# Patient Record
Sex: Male | Born: 1960 | Race: White | Hispanic: No | Marital: Married | Smoking: Never smoker
Health system: Southern US, Community
[De-identification: ages and names within clinical notes are randomized; demographics above are authoritative.]

## PROBLEM LIST (undated history)

## (undated) ENCOUNTER — Emergency Department (HOSPITAL_COMMUNITY): Admission: EM | Payer: Self-pay | Source: Home / Self Care

## (undated) DIAGNOSIS — I213 ST elevation (STEMI) myocardial infarction of unspecified site: Secondary | ICD-10-CM

## (undated) DIAGNOSIS — E785 Hyperlipidemia, unspecified: Secondary | ICD-10-CM

## (undated) DIAGNOSIS — I119 Hypertensive heart disease without heart failure: Secondary | ICD-10-CM

## (undated) DIAGNOSIS — N289 Disorder of kidney and ureter, unspecified: Secondary | ICD-10-CM

## (undated) DIAGNOSIS — I2510 Atherosclerotic heart disease of native coronary artery without angina pectoris: Secondary | ICD-10-CM

## (undated) DIAGNOSIS — I519 Heart disease, unspecified: Secondary | ICD-10-CM

## (undated) HISTORY — DX: Hyperlipidemia, unspecified: E78.5

## (undated) HISTORY — DX: Heart disease, unspecified: I51.9

## (undated) HISTORY — DX: Atherosclerotic heart disease of native coronary artery without angina pectoris: I25.10

## (undated) HISTORY — DX: Hypertensive heart disease without heart failure: I11.9

## (undated) HISTORY — DX: ST elevation (STEMI) myocardial infarction of unspecified site: I21.3

## (undated) HISTORY — DX: Disorder of kidney and ureter, unspecified: N28.9

---

## 2002-06-18 ENCOUNTER — Ambulatory Visit (HOSPITAL_COMMUNITY): Admission: RE | Admit: 2002-06-18 | Discharge: 2002-06-18 | Payer: Self-pay | Admitting: Internal Medicine

## 2002-06-18 ENCOUNTER — Encounter: Payer: Self-pay | Admitting: Internal Medicine

## 2010-12-26 DIAGNOSIS — I213 ST elevation (STEMI) myocardial infarction of unspecified site: Secondary | ICD-10-CM

## 2010-12-26 HISTORY — PX: CARDIAC CATHETERIZATION: SHX172

## 2010-12-26 HISTORY — DX: ST elevation (STEMI) myocardial infarction of unspecified site: I21.3

## 2010-12-31 ENCOUNTER — Inpatient Hospital Stay (HOSPITAL_COMMUNITY)
Admission: EM | Admit: 2010-12-31 | Discharge: 2011-01-02 | DRG: 853 | Disposition: A | Discharge status: Home / Self Care | Payer: BC Managed Care – PPO | Source: Ambulatory Visit | Attending: Cardiology | Admitting: Cardiology

## 2010-12-31 ENCOUNTER — Inpatient Hospital Stay (HOSPITAL_COMMUNITY): Payer: BC Managed Care – PPO

## 2010-12-31 DIAGNOSIS — R079 Chest pain, unspecified: Secondary | ICD-10-CM

## 2010-12-31 DIAGNOSIS — N289 Disorder of kidney and ureter, unspecified: Secondary | ICD-10-CM | POA: Diagnosis present

## 2010-12-31 DIAGNOSIS — I2119 ST elevation (STEMI) myocardial infarction involving other coronary artery of inferior wall: Principal | ICD-10-CM | POA: Diagnosis present

## 2010-12-31 DIAGNOSIS — E785 Hyperlipidemia, unspecified: Secondary | ICD-10-CM | POA: Diagnosis present

## 2010-12-31 DIAGNOSIS — I2589 Other forms of chronic ischemic heart disease: Secondary | ICD-10-CM | POA: Diagnosis present

## 2010-12-31 DIAGNOSIS — I2510 Atherosclerotic heart disease of native coronary artery without angina pectoris: Secondary | ICD-10-CM | POA: Diagnosis present

## 2010-12-31 LAB — COMPREHENSIVE METABOLIC PANEL
ALT: 27 U/L (ref 0–53)
Alkaline Phosphatase: 39 U/L (ref 39–117)
CO2: 23 mEq/L (ref 19–32)
Calcium: 8.4 mg/dL (ref 8.4–10.5)
Chloride: 106 mEq/L (ref 96–112)
GFR calc Af Amer: >60 mL/min (ref >60)
GFR calc non Af Amer: >60 mL/min (ref >60)
Glucose, Bld: 121 mg/dL — ABNORMAL HIGH (ref 70–99)
Sodium: 136 mEq/L (ref 135–145)
Total Bilirubin: 0.3 mg/dL (ref 0.3–1.2)

## 2010-12-31 LAB — CBC
HCT: 40.5 % (ref 39.0–52.0)
Hemoglobin: 14.2 g/dL (ref 13.0–17.0)
MCH: 31.9 pg (ref 26.0–34.0)
MCHC: 35.1 g/dL (ref 30.0–36.0)
RBC: 4.45 MIL/uL (ref 4.22–5.81)
RDW: 13.2 % (ref 11.5–15.5)
WBC: 9.9 10*3/uL (ref 4.0–10.5)

## 2010-12-31 LAB — PROTIME-INR
INR: 6.83 (ref 0.00–1.49)
Prothrombin Time: 60.1 seconds — ABNORMAL HIGH (ref 11.6–15.2)

## 2010-12-31 LAB — POCT I-STAT, CHEM 8
HCT: 47 % (ref 39.0–52.0)
Hemoglobin: 16 g/dL (ref 13.0–17.0)
Potassium: 3.4 mEq/L — ABNORMAL LOW (ref 3.5–5.1)
Sodium: 140 mEq/L (ref 135–145)
TCO2: 23 mmol/L (ref 0–100)

## 2010-12-31 LAB — APTT: aPTT: 121 seconds — ABNORMAL HIGH (ref 24–37)

## 2010-12-31 LAB — CARDIAC PANEL(CRET KIN+CKTOT+MB+TROPI)
CK, MB: 14.5 ng/mL (ref 0.3–4.0)
Relative Index: 2.7 — ABNORMAL HIGH (ref 0.0–2.5)
Total CK: 531 U/L — ABNORMAL HIGH (ref 7–232)
Total CK: 2193 U/L — ABNORMAL HIGH (ref 7–232)
Troponin I: 3.57 ng/mL (ref <0.30)

## 2010-12-31 LAB — MRSA PCR SCREENING: MRSA by PCR: NEGATIVE

## 2010-12-31 LAB — POCT ACTIVATED CLOTTING TIME: Activated Clotting Time: 386 seconds

## 2010-12-31 LAB — LIPID PANEL: HDL: 26 mg/dL — ABNORMAL LOW (ref >39)

## 2010-12-31 LAB — HEMOGLOBIN A1C
Hgb A1c MFr Bld: 5.6 % (ref <5.7)
Mean Plasma Glucose: 114 mg/dL (ref <117)

## 2010-12-31 LAB — TSH: TSH: 1.682 u[IU]/mL (ref 0.350–4.500)

## 2011-01-01 DIAGNOSIS — I059 Rheumatic mitral valve disease, unspecified: Secondary | ICD-10-CM

## 2011-01-01 LAB — CARDIAC PANEL(CRET KIN+CKTOT+MB+TROPI)
CK, MB: 124.2 ng/mL (ref 0.3–4.0)
Total CK: 1381 U/L — ABNORMAL HIGH (ref 7–232)
Troponin I: >25 ng/mL (ref <0.30)

## 2011-01-01 LAB — LIPID PANEL
Cholesterol: 138 mg/dL (ref 0–200)
Total CHOL/HDL Ratio: 4.5 RATIO
VLDL: 14 mg/dL (ref 0–40)

## 2011-01-01 LAB — CBC
HCT: 46.7 % (ref 39.0–52.0)
MCH: 32.6 pg (ref 26.0–34.0)
MCV: 91.2 fL (ref 78.0–100.0)
Platelets: 228 10*3/uL (ref 150–400)
RBC: 5.12 MIL/uL (ref 4.22–5.81)
RDW: 13.4 % (ref 11.5–15.5)

## 2011-01-01 LAB — BASIC METABOLIC PANEL
BUN: 10 mg/dL (ref 6–23)
CO2: 27 mEq/L (ref 19–32)
Calcium: 9.6 mg/dL (ref 8.4–10.5)
Chloride: 106 mEq/L (ref 96–112)
Creatinine, Ser: 1.2 mg/dL (ref 0.50–1.35)

## 2011-01-01 LAB — DIFFERENTIAL
Eosinophils Absolute: 0.2 10*3/uL (ref 0.0–0.7)
Eosinophils Relative: 1 % (ref 0–5)
Lymphocytes Relative: 24 % (ref 12–46)
Lymphs Abs: 2.6 10*3/uL (ref 0.7–4.0)
Monocytes Relative: 11 % (ref 3–12)

## 2011-01-02 DIAGNOSIS — I2119 ST elevation (STEMI) myocardial infarction involving other coronary artery of inferior wall: Secondary | ICD-10-CM

## 2011-01-02 LAB — BASIC METABOLIC PANEL
Calcium: 10.1 mg/dL (ref 8.4–10.5)
Creatinine, Ser: 1.38 mg/dL — ABNORMAL HIGH (ref 0.50–1.35)
GFR calc Af Amer: >60 mL/min (ref >60)
GFR calc non Af Amer: 54 mL/min — ABNORMAL LOW (ref >60)
Sodium: 139 mEq/L (ref 135–145)

## 2011-01-02 LAB — CBC
MCH: 31.8 pg (ref 26.0–34.0)
MCHC: 34.5 g/dL (ref 30.0–36.0)
MCV: 92.2 fL (ref 78.0–100.0)
Platelets: 240 10*3/uL (ref 150–400)
RBC: 5.25 MIL/uL (ref 4.22–5.81)
RDW: 13.5 % (ref 11.5–15.5)

## 2011-01-04 ENCOUNTER — Other Ambulatory Visit (INDEPENDENT_AMBULATORY_CARE_PROVIDER_SITE_OTHER): Payer: BC Managed Care – PPO | Admitting: *Deleted

## 2011-01-04 ENCOUNTER — Other Ambulatory Visit: Payer: Self-pay | Admitting: *Deleted

## 2011-01-04 DIAGNOSIS — N289 Disorder of kidney and ureter, unspecified: Secondary | ICD-10-CM

## 2011-01-04 LAB — BASIC METABOLIC PANEL
Calcium: 10.6 mg/dL — ABNORMAL HIGH (ref 8.4–10.5)
GFR: 54.9 mL/min — ABNORMAL LOW (ref >60.00)
Glucose, Bld: 117 mg/dL — ABNORMAL HIGH (ref 70–99)
Potassium: 4.7 mEq/L (ref 3.5–5.1)
Sodium: 138 mEq/L (ref 135–145)

## 2011-01-04 NOTE — Discharge Summary (Signed)
NAMESULEIMAN, FINIGAN NO.:  [id number]  MEDICAL RECORD NO.:  [id number]  LOCATION:  2010                         FACILITY:  [facility]  PHYSICIAN:  Madolyn Frieze. Jens Som, MD, FACCDATE OF BIRTH:  07/24/1960  DATE OF ADMISSION:  12/31/2010 DATE OF DISCHARGE:  01/02/2011                              DISCHARGE SUMMARY   PRIMARY CARDIOLOGIST:  Peter M. Swaziland, MD  PRIMARY CARE PHYSICIAN:  None.  REASON FOR ADMISSION:  Inferior ST-elevation myocardial infarction.  DISCHARGE DIAGNOSES: 1. Coronary artery disease.     a.     Status post inferior ST-elevation myocardial infarction      treated with a drug-eluting stent to the right coronary artery      this admission. 2. Ischemic cardiomyopathy with an ejection fraction of 45-50%. 3. Dyslipidemia. 4. Renal insufficiency.  PROCEDURES PERFORMED THIS ADMISSION:  Cardiac catheterization and percutaneous coronary intervention by Dr. Peter Swaziland December 31, 2010:  RCA large dominant vessel with 100% mid occlusion, left main patent, LAD 20-30% proximal, left circumflex 20% proximal.  PCI:  Promus drug-eluting stent to the mid RCA.  Left ventriculogram of moderate inferior hypokinesis with an EF of 45-50%.  ADMISSION HISTORY:  Mr. Nicolosi is a 50 year old male patient without prior cardiac history who presented via EMS with 10/10 chest pain.  EKG demonstrated inferior ST-elevation.  He was brought emergently to Cardiac Catheterization Lab.  HOSPITAL COURSE:  The patient underwent cardiac catheterization and percutaneous coronary intervention by Dr. Peter Swaziland as outlined above.  He tolerated the procedure well and had no immediate complications. Post MI he was maintained on aspirin, Effient, beta-blocker, and statin. Lisinopril was also added to his medical regimen.  His creatinine increased from 1.2-1.38 post initiation of lisinopril.  The patient was evaluated by Dr. Jens Som on September 8.  He discontinued ACE  inhibitor secondary to increased creatinine.  The patient will need early followup with a basic metabolic panel next week.  He was felt stable enough for discharge to home.  LABS AND ANCILLARY DATA:  Hemoglobin 16.7, potassium 4, creatinine 1.38 on January 02, 2011, ALT 27 upon admission, hemoglobin A1c 5.6.  Peak troponin greater than 25.  Peak CK-MB 192.7, total cholesterol 138, triglycerides 70, HDL 31, LDL 93.  TSH 1.682.  Chest x-ray, normal chest.  DISCHARGE MEDICATIONS: 1. Metoprolol tartrate 25 mg 1/2 tablet twice daily. 2. Nitroglycerin p.r.n. chest pain. 3. Effient 10 mg daily. 4. Crestor 40 mg daily. 5. Aspirin 81 mg daily. 6. Multivitamins daily.  ALLERGIES:  No known drug allergies.  ACTIVITIES:  Increase activity slowly, may walk up steps.  No lifting for 2 weeks.  No driving for 1 week.  No sexual activity for 2 weeks.  DIET:  Low-fat, low-sodium diet.  WOUND CARE:  He should call [facility] office for any swelling, bleeding, bruising, or fever.  FOLLOWUP: 1. He needs a basic metabolic panel to follow up on his renal function     January 04, 2011.  The office will contact him with an     appointment. 2. He will see Dr. Swaziland in 2 weeks.  The office will contact him     with an appointment.  TOTAL PHYSICIAN AND  PA TIME:  Greater than 30 minutes.     Tereso Newcomer, PA-C   ______________________________ Madolyn Frieze. Jens Som, MD, [facility]    SW/MEDQ  D:  01/02/2011  T:  01/02/2011  Job:  [id number]  Electronically Signed by Tereso Newcomer PA-C on 01/03/2011 04:34:43 PM Electronically Signed by Olga Millers MD [facility] on 01/04/2011 07:31:07 PM

## 2011-01-06 NOTE — Progress Notes (Signed)
Wrong phone number

## 2011-01-08 ENCOUNTER — Telehealth: Payer: Self-pay | Admitting: *Deleted

## 2011-01-08 NOTE — Telephone Encounter (Signed)
Have been unable to get in touch w/pt. Have tried several phone numbers. Will mail him a copy of labs and to call us if has qustions.

## 2011-01-08 NOTE — Telephone Encounter (Signed)
Message copied by Lorayne Bender on Fri Jan 08, 2011  8:08 AM ------      Message from: Sw[place], PETER M      Created: Mon Jan 04, 2011  8:34 PM       Creatnine stable at 1.4. Was 1.38 on discharge.

## 2011-01-08 NOTE — Cardiovascular Report (Signed)
Alan Hernandez, Alan Hernandez NO.:  [id number]  MEDICAL RECORD NO.:  [id number]  LOCATION:  2904                         FACILITY:  [facility]  PHYSICIAN:  Zedric Deroy M. [place]and, M.D.  DATE OF BIRTH:  May 11, 1960  DATE OF PROCEDURE:  12/31/2010 DATE OF DISCHARGE:                           CARDIAC CATHETERIZATION   INDICATIONS FOR PROCEDURE:  This is a 50 year old white male who had abrupt onset of severe substernal chest pain at 9:30 a.m.  EMS was called and ECG demonstrated evidence of an inferior ST elevation myocardial infarction with 2-3 mm of ST elevation in leads II, III, and aVF.  PROCEDURES:  Left heart catheterization, coronary and left ventricular angiography.  OPERATOR:  Elga Santy M. [place], MD  ACCESS:  Via the right radial artery using standard Seldinger technique.  EQUIPMENT:  A 6-French 4-cm right Judkins catheter, 6-French 3.5 cm left Judkins catheter, 6-French pigtail catheter, 6-French arterial sheath, 6 FR-4 guide, a Prowater wire, a 2.5 x 12 mm Emerge balloon, a 4.0 x 16 mm Promus element stent, and a 4.0 x 12 mm Rudd TREK balloon.  MEDICATIONS:  Verapamil 3 mg intra-arterial, Angiomax bolus at 0.75 mg/kg followed by continuous infusion 1.75 mg/kg/hour, and Effient 60 mg p.o.  Subsequent ACT was 383 seconds.  CONTRAST:  125 mL of Omnipaque.  HEMODYNAMIC DATA:  Aortic pressure was 139/88 with a mean of 110 mmHg. Left ventricular pressure was 139 with an EDP of 20 mmHg.  ANGIOGRAPHIC DATA:  The right coronary arises and distributes normally. It is a large dominant vessel.  It was completely occluded in the midvessel with an abrupt cutoff.  The left main coronary is normal.  The left anterior descending artery has 20-30% irregularities in the proximal vessel.  It otherwise is normal.  There are faint collaterals to the right coronary artery.  Left circumflex coronary has 20% proximal disease.  We proceeded at this point with emergent intervention of the  right coronary.  After initial guide shots were obtained, we were able to cross the lesion easily with a wire.  We then had reperfusion with dilatation of the lesion with a 2.5 mm balloon up to 6 and then 10 atmospheres.  It was a very large vessel with a relatively focal lesion. This was stented using a 4.0 x 16 mm Promus element stent deployed at 12 atmospheres.  The stent was then postdilated with a 4.0 x 12 mm [place] TREK balloon to 16 atmospheres x2.  This yielded an excellent angiographic result with 0% residual stenosis and TIMI grade 3 flow.  There was marked improvement on his ST elevation.  With initial reperfusion, he did have some frequent ventricular ectopy but this resolved.  Left ventricular angiography was performed in the RAO view.  This demonstrates normal left ventricular size with moderate inferior wall hypokinesis.  Ejection fraction was 45-50%.  FINAL INTERPRETATION: 1. Single-vessel occlusive atherosclerotic coronary artery disease. 2. Mild left ventricular dysfunction. 3. Successful percutaneous intervention of the mid right coronary with     a drug-eluting stent.  PLAN:  We will continue on aspirin, Effient, beta-blocker, and Crestor.          ______________________________ Kyan Yurkovich M. [place], M.D.  PMJ/MEDQ  D:  12/31/2010  T:  12/31/2010  Job:  [id number]  Electronically Signed by Ivannah Zody Sw[place]iland M.D. on 01/08/2011 05:11:26 PM

## 2011-01-13 ENCOUNTER — Encounter: Payer: Self-pay | Admitting: *Deleted

## 2011-01-18 ENCOUNTER — Ambulatory Visit (INDEPENDENT_AMBULATORY_CARE_PROVIDER_SITE_OTHER): Payer: BC Managed Care – PPO | Admitting: Nurse Practitioner

## 2011-01-18 ENCOUNTER — Encounter: Payer: Self-pay | Admitting: Nurse Practitioner

## 2011-01-18 DIAGNOSIS — I519 Heart disease, unspecified: Secondary | ICD-10-CM

## 2011-01-18 DIAGNOSIS — Z9861 Coronary angioplasty status: Secondary | ICD-10-CM | POA: Insufficient documentation

## 2011-01-18 DIAGNOSIS — I5020 Unspecified systolic (congestive) heart failure: Secondary | ICD-10-CM

## 2011-01-18 DIAGNOSIS — I2510 Atherosclerotic heart disease of native coronary artery without angina pectoris: Secondary | ICD-10-CM

## 2011-01-18 LAB — BASIC METABOLIC PANEL
BUN: 12 mg/dL (ref 6–23)
CO2: 30 mEq/L (ref 19–32)
Calcium: 10 mg/dL (ref 8.4–10.5)
Chloride: 103 mEq/L (ref 96–112)
Creatinine, Ser: 1.4 mg/dL (ref 0.4–1.5)
GFR: 58.64 mL/min — ABNORMAL LOW (ref >60.00)
Glucose, Bld: 89 mg/dL (ref 70–99)
Potassium: 4.9 mEq/L (ref 3.5–5.1)
Sodium: 140 mEq/L (ref 135–145)

## 2011-01-18 MED ORDER — LISINOPRIL 5 MG PO TABS: 5.0000 mg | ORAL_TABLET | Freq: Every day | ORAL | Status: DC

## 2011-01-18 NOTE — Assessment & Plan Note (Signed)
I think he is doing well. We will try to get him in rehab. I explained the importance of gradually resuming his activities. I think he needs to avoid the heavy weight lifting. He will continue with his current medicines. Lisinopril was added today for his low EF. I will see again in about 2 weeks.

## 2011-01-18 NOTE — Progress Notes (Signed)
Alan Hernandez Date of Birth: 09/21/1960   History of Present Illness: [name] is seen today for a post hospital visit. He is seen for Dr. Swaziland. He had a STEMI with DES to the RCA. He has done well. No recurrent chest pain. He did have some chest pain with deep inspiration prior to his MI and has had a little bit of that. Nothing like his prior chest pain syndrome. He is anxious to get back into the gym and lift weights. He is taking his medicines but does not like the way Effient makes him feel. He says he gets a little nauseated and tired. It is getting better. He is committed to one year of antiplatelet therapy. He was tried on ACE but had some rise in his renal function. Recheck shortly after discharge looked better. His EF is down a little and we will try to restart. Peak MB's were 193.  Current Outpatient Prescriptions on File Prior to Visit  Medication Sig Dispense Refill  . aspirin 81 MG tablet Take 81 mg by mouth daily.        . metoprolol tartrate (LOPRESSOR) 25 MG tablet Take 12.5 mg by mouth 2 (two) times daily.        . multivitamin (THERAGRAN) per tablet Take 1 tablet by mouth daily.        . nitroGLYCERIN (NITROSTAT) 0.4 MG SL tablet Place 0.4 mg under the tongue every 5 (five) minutes as needed.        . prasugrel (EFFIENT) 10 MG TABS Take 10 mg by mouth daily.        . rosuvastatin (CRESTOR) 40 MG tablet Take 40 mg by mouth daily.          No Known Allergies  Past Medical History  Diagnosis Date  . STEMI (ST elevation myocardial infarction) Sept 2012    Inferior wall, s/p DES to RCA. EF 45 to 50%  . Coronary artery disease     Status post inferior ST-elevation myocardial infarction treated with a drug-eluting stent to the right coronary artery this admission  . Left ventricular dysfunction     with an ejection fraction of 45-50%  . Dyslipidemia   . Renal insufficiency     Past Surgical History  Procedure Date  . Cardiac catheterization Sept 2012   Ejection fraction was 45-50% -- Single-vessel occlusive atherosclerotic coronary artery disease; Successful percutaneous intervention of the mid right coronary with a drug-eluting stent    History  Smoking status  . Never Smoker   Smokeless tobacco  . Not on file    History  Alcohol Use No    Family History  Problem Relation Age of Onset  . Heart disease      grandparents    Review of Systems: The review of systems is as above.  All other systems were reviewed and are negative.  Physical Exam: BP 136/88  Pulse 64  Ht 6' (1.829 m)  Wt 190 lb 3.2 oz (86.274 kg)  BMI 25.80 kg/m2 Patient is very pleasant and in no acute distress. He is very muscular. Skin is warm and dry. Color is normal.  HEENT is unremarkable. Normocephalic/atraumatic. PERRL. Sclera are nonicteric. Neck is supple. No masses. No JVD. Lungs are clear. Cardiac exam shows a regular rate and rhythm. No S3. Abdomen is soft. Extremities are without edema. Right arm cath site is ok. Gait and ROM are intact. No gross neurologic deficits noted.  LABORATORY DATA:   Assessment / Plan:

## 2011-01-18 NOTE — Assessment & Plan Note (Addendum)
Lisinopril 5 mg is added back. Will check BMET today and on return. He will need a follow up echo in a couple of months to relook at his pumping function. Weighing and salt restriction was encouraged. Patient is agreeable to this plan and will call if any problems develop in the interim.

## 2011-01-18 NOTE — Patient Instructions (Addendum)
We are going to start you on Lisinopril 5 mg daily. Take your first dose tonight and then one each day. This is to help with your pumping function I will see you in 2 weeks We will check labs today and when you come back. You do not need to fast You may walk 15 to 20 minutes daily. May increase with a goal of 45 minutes per day Call for any problems  Heart Failure Heart failure (HF) is a condition in which the heart has trouble pumping blood. This means your heart does not pump blood efficiently for your body to work well. In some cases of HF, fluid may back up into your lungs or you may have swelling (edema) in your lower legs. HF is a long-term (chronic) condition. It is important for you to take good care of yourself and follow your caregiver's treatment plan. CAUSES  Health conditions:   High blood pressure (hypertension) causes the heart muscle to work harder than normal. When pressure in the blood vessels is high, the heart needs to pump (contract) with more force in order to circulate blood throughout the body. High blood pressure eventually causes the heart to become stiff and weak.   Coronary artery disease (CAD) is the buildup of cholesterol and fat (plaques) in the arteries of the heart. The blockage in the arteries deprives the heart muscle of oxygen and blood. This can cause chest pain and may lead to a heart attack. CAD can also contribute to high blood pressure.   Heart attack (myocardial infarction) occurs when 1 or more arteries in the heart become blocked. The loss of oxygen damages the muscle tissue of the heart. When this happens, part of the heart muscle dies. The injured tissue does not contract as well and weakens the heart's ability to pump blood.   Abnormal heart valves can cause HF when the heart valves do not open and close properly. This makes the heart muscle pump harder to keep the blood flowing.   Heart muscle disease (cardiomyopathy or myocarditis) is damage to the  heart muscle from a variety of causes. These can include drug or alcohol abuse, infections, or unknown reasons. These can increase the risk of HF.   Lung disease makes the heart work harder because the lungs do not work properly. This can cause a strain on the heart leading it to fail.   Diabetes increases the risk of HF. High blood sugar contributes to high fat (lipid) levels in the blood. Diabetes can also cause slow damage to tiny blood vessels that carry important nutrients to the heart muscle. When the heart does not get enough oxygen and food, it can cause the heart to become weak and stiff. This leads to a heart that does not contract efficiently.   Other diseases can contribute to HF. These include abnormal heart rhythms, thyroid problems and low blood counts (anemia).   Unhealthy lifestyle habits:   Obesity.   Smoking.   Eating foods high in fat and cholesterol.   Eating or drinking beverages high in salt.   Drug or alcohol abuse.   Lack of exercise.  SYMPTOMS HF symptoms may vary and can be hard to detect. Symptoms may include:  Shortness of breath with activity, such as climbing stairs.   Persistent cough.   Swelling of the feet, ankles, legs, or abdomen.   Unexplained weight gain.   Difficulty breathing when lying flat.   Waking from sleep because of the need to sit up  and get more air.   Rapid heartbeat.   Fatigue and loss of energy.   Feeling lightheaded or close to fainting.  DIAGNOSIS A diagnosis of HF is based on your history, symptoms, physical examination, and diagnostic tests. Diagnostic tests for HF may include:  EKG.  Chest X-ray.   Blood tests.   Exercise stress test.   Blood oxygen test (arterial blood gas).   Evaluation by a heart doctor (cardiologist).  Ultrasound evaluation of the heart (echocardiogram).   Heart artery test to look for blockages (angiogram).   Radioactive imaging to look at the heart (radionuclide test).     TREATMENT Treatment is aimed at managing the symptoms of HF. Medicines, lifestyle changes, or surgical intervention may be necessary to treat HF.  Medicines to help treat HF may include:   Angiotensin-converting enzyme (ACE) inhibitors. These block the effects of a blood protein called angiotensin-converting enzyme. ACE inhibitors relax (dilate) the blood vessels and help lower blood pressure. This decreases the workload of the heart, slows the progression of HF, and improves symptoms.   Angiotensin receptor blockers (ARBs). Medications in this drug class work in a way similar to ACE inhibitors. ARBs may be an alternative for people who cannot tolerate an ACE inhibitor.   Aldosterone Antagonists. This medication helps get rid of extra fluid from your body. This lowers the volume of blood the heart has to pump.   Water pills (diuretics). Diuretics cause the kidneys to remove salt and water from the blood. The extra fluid is removed by urination. By removing extra fluid from the body, diuretics help lower the workload of the heart and help prevent fluid buildup in the lungs so breathing is easier.   Beta blockers. These prevent the heart from beating too fast and improve heart muscle strength. Beta blockers help maintain a normal heart rate, control blood pressure, and improve HF symptoms.   Digitalis. This increases the force of the heartbeat and may be helpful to people with HF or heart rhythm problems.   Healthy lifestyle changes include:   Stopping smoking.   Eating a healthy diet. Avoid foods high in fat. Avoid foods fried in oil or made with fat. A dietician can help with healthy food choices.   Limiting how much salt you eat.   Limiting alcohol intake to no more than 1 drink per day for women and 2 drinks per day for men. Drinking more than that is harmful to your heart. If your heart has already been damaged by alcohol or you have severe HF, drinking alcohol should be stopped  completely.   Exercising as directed by your caregiver.   Surgical treatment for HF may include:   Procedures to open blocked arteries, repair damaged heart valves, or remove damaged heart muscle tissue.   A pacemaker to help heart muscle function and to control certain abnormal heart rhythms.   A defibrillator to possibly prevent sudden cardiac death.  HOME CARE INSTRUCTIONS  Activity level. Your caregiver can help you determine what type of exercise program may be helpful. It is important to maintain your strength. Pace your physical activity to avoid shortness of breath or chest pain. Rest for 1 hour before and after meals. A cardiac rehabilitation program may be helpful to some people with HF.   Diet. Eat a heart-healthy diet. Food choices should be low in saturated fat and cholesterol. Talk to a dietician to learn about heart healthy foods.   Salt intake. When you have HF, you need to  limit the amount of salt you eat. Eat less than 2000 milligrams (mg) of salt per day or as recommended by your caregiver.   Weight monitoring. Weigh yourself everyday. You should weigh yourself in the morning after you urinate and before you eat breakfast. Wear the same amount of clothing each time you weigh yourself. Record your weight daily. Bring your recorded weights to your clinic visits. Tell your caregiver right away if you have gained 3 in 1 day, or 5 in a week or whatever amount you were told to report.   Blood pressure monitoring. This should be done as directed by your caregiver. A home blood pressure cuff can be purchased at a drugstore. Record your blood pressure numbers and bring them to your clinic visits. Tell your caregiver if you become dizzy or lightheaded upon standing up.   Smoking. If you are currently a smoker, it is time to quit. Nicotine makes your heart work harder by causing your blood vessels to constrict. Do not use nicotine gum or patches before talking to your caregiver.    Follow up. Be sure to schedule a follow-up visit with your caregiver. Keep all your appointments.  SEEK MEDICAL CARE IF:  Your weight increases by 3 pounds unn 1 day or 5 pounds in a week.   You notice increasing shortness of breath that is unusual for you. This may happen during rest, sleep or with activity.   You cough more than normal, especially with physical activity.   You notice more swelling in your hands, feet, ankles, or belly (abdomen).   You are unable to sleep because it is hard to breathe.   You cough up bloody mucus (sputum).   You begin to feel "jumping" or "fluttering" sensations (palpitations) in your chest.  SEEK IMMEDIATE MEDICAL CARE IF:  You have severe chest pain or pressure which may include symptoms such as:   Pain or pressure in the arms, neck, jaw, or back.   Feeling sweaty.   Feeling sick to your stomach (nauseous).   Feeling short of breath while at rest.   Having a fast or irregular heartbeat.   You experience stroke symptoms. These symptoms include:   Facial weakness or numbness.   Weakness or numbness in an arm, leg or on one side of your body.   Blurred vision.   Difficulty talking or thinking.   Dizziness or fainting.   Severe headache.  THESE ARE MEDICAL EMERGENCIES. Do not wait to see if the symptoms go away. Call your local emergency services (911 in [place] DO NOT drive yourself to the hospital. IMPORTANT  Make a list of every medicine, vitamin, or herbal supplement you are taking. Keep the list with you at all times. Show it to your caregiver at every visit. Keep the list up-to-date.   Ask your caregiver or pharmacist to write an explanation of each medicine you are taking. This should include:   Why you are taking it.   The possible side effects.   The best time of day to take it.   Foods to take with it or what foods to avoid.   When to stop taking it.  MAKE SURE YOU:   Understand these instructions.   Will  watch your condition.   Will get help right away if you are not doing well or get worse.  Document Released: 04/12/2005 Document Re-Released: 09/30/2009 [facility] Patient Information 2011 [place], [place].

## 2011-02-01 ENCOUNTER — Encounter (HOSPITAL_COMMUNITY): Payer: BC Managed Care – PPO | Attending: Cardiology

## 2011-02-01 ENCOUNTER — Encounter: Payer: Self-pay | Admitting: Nurse Practitioner

## 2011-02-01 ENCOUNTER — Ambulatory Visit (INDEPENDENT_AMBULATORY_CARE_PROVIDER_SITE_OTHER): Payer: BC Managed Care – PPO | Admitting: Nurse Practitioner

## 2011-02-01 DIAGNOSIS — I2510 Atherosclerotic heart disease of native coronary artery without angina pectoris: Secondary | ICD-10-CM | POA: Insufficient documentation

## 2011-02-01 DIAGNOSIS — I2589 Other forms of chronic ischemic heart disease: Secondary | ICD-10-CM | POA: Insufficient documentation

## 2011-02-01 DIAGNOSIS — E785 Hyperlipidemia, unspecified: Secondary | ICD-10-CM | POA: Insufficient documentation

## 2011-02-01 DIAGNOSIS — I5020 Unspecified systolic (congestive) heart failure: Secondary | ICD-10-CM

## 2011-02-01 DIAGNOSIS — Z9861 Coronary angioplasty status: Secondary | ICD-10-CM | POA: Insufficient documentation

## 2011-02-01 DIAGNOSIS — I2119 ST elevation (STEMI) myocardial infarction involving other coronary artery of inferior wall: Secondary | ICD-10-CM | POA: Insufficient documentation

## 2011-02-01 DIAGNOSIS — N289 Disorder of kidney and ureter, unspecified: Secondary | ICD-10-CM | POA: Insufficient documentation

## 2011-02-01 DIAGNOSIS — I519 Heart disease, unspecified: Secondary | ICD-10-CM

## 2011-02-01 DIAGNOSIS — Z5189 Encounter for other specified aftercare: Secondary | ICD-10-CM | POA: Insufficient documentation

## 2011-02-01 LAB — BASIC METABOLIC PANEL
BUN: 9 mg/dL (ref 6–23)
CO2: 28 mEq/L (ref 19–32)
Calcium: 9.7 mg/dL (ref 8.4–10.5)
Chloride: 105 mEq/L (ref 96–112)
Creatinine, Ser: 1.5 mg/dL (ref 0.4–1.5)
GFR: 54.45 mL/min — ABNORMAL LOW (ref >60.00)
Glucose, Bld: 75 mg/dL (ref 70–99)
Potassium: 4 mEq/L (ref 3.5–5.1)
Sodium: 140 mEq/L (ref 135–145)

## 2011-02-01 MED ORDER — METOPROLOL TARTRATE 25 MG PO TABS: 25.0000 mg | ORAL_TABLET | Freq: Two times a day (BID) | ORAL | Status: DC

## 2011-02-01 NOTE — Assessment & Plan Note (Signed)
He is tolerating the ACE. Metoprolol is increased today. I will see him back in 3 weeks. He will need a follow up echo in the future to reassess EF. Patient is agreeable to this plan and will call if any problems develop in the interim.

## 2011-02-01 NOTE — Progress Notes (Signed)
    Alan Hernandez Date of Birth: 19-Sep-1960   History of Present Illness: Alan Hernandez is seen back today for a 3 week check. He is seen for Dr. Swazil[place]. He had a STEMI with DES to the RCA. EF was 45 to 50%. He is on beta blocker and ACE. He has continued to have some chest discomfort with deep inspiration. This was present before his MI and was not like his MI symptoms. He has done well with his ACE. He is starting rehab today. No shortness of breath. Weight is good. He is currently feeling well and has no complaint.   Current Outpatient Prescriptions on File Prior to Visit  Medication Sig Dispense Refill  . aspirin 81 MG tablet Take 81 mg by mouth daily.        Marland Kitchen lisinopril (PRINIVIL,ZESTRIL) 5 MG tablet Take 1 tablet (5 mg total) by mouth daily.  30 tablet  11  . multivitamin (THERAGRAN) per tablet Take 1 tablet by mouth daily.        . nitroGLYCERIN (NITROSTAT) 0.4 MG SL tablet Place 0.4 mg under the tongue every 5 (five) minutes as needed.        . prasugrel (EFFIENT) 10 MG TABS Take 10 mg by mouth daily.        . rosuvastatin (CRESTOR) 40 MG tablet Take 40 mg by mouth daily.        Marland Kitchen DISCONTD: metoprolol tartrate (LOPRESSOR) 25 MG tablet Take 12.5 mg by mouth 2 (two) times daily.          No Known Allergies  Past Medical History  Diagnosis Date  . STEMI (ST elevation myocardial infarction) Sept 2012    Inferior wall, s/p DES to RCA. EF 45 to 50%  . Coronary artery disease     Status post inferior ST-elevation myocardial infarction treated with a drug-eluting stent to the right coronary artery this admission  . Left ventricular dysfunction Sept 2012    with an ejection fraction of 45-50%  . Dyslipidemia   . Renal insufficiency     Past Surgical History  Procedure Date  . Cardiac catheterization Sept 2012    Ejection fraction was 45-50% -- Single-vessel occlusive atherosclerotic coronary artery disease; Successful percutaneous intervention of the mid right coronary with a  drug-eluting stent    History  Smoking status  . Never Smoker   Smokeless tobacco  . Not on file    History  Alcohol Use No    Family History  Problem Relation Age of Onset  . Heart disease      grandparents    Review of Systems: The review of systems is per the HPI.  All other systems were reviewed and are negative.  Physical Exam: BP 126/78  Pulse 70  Ht 6' (1.829 m)  Wt 193 lb 1.9 oz (87.599 kg)  BMI 26.19 kg/m2 Patient is very pleasant and in no acute distress. He is quite muscular. Skin is warm and dry. Color is normal.  HEENT is unremarkable. Normocephalic/atraumatic. PERRL. Sclera are nonicteric. Neck is supple. No masses. No JVD. Lungs are clear. Cardiac exam shows a regular rate and rhythm. No S3.  Abdomen is soft. Extremities are without edema. Gait and ROM are intact. No gross neurologic deficits noted.   LABORATORY DATA: BMET is pending   Assessment / Plan:

## 2011-02-01 NOTE — Assessment & Plan Note (Signed)
He is doing well. Will start rehab today. Metoprolol is increased today. Will see him back in 3 weeks.

## 2011-02-01 NOTE — Patient Instructions (Signed)
Increase your metoprolol to a whole tablet 2 times a day We will see you back in 3 weeks. Stay on your other medicines Goal for walking is 45 minutes per day. OK to start cardiac rehab.  Call for any problems.

## 2011-02-02 NOTE — H&P (Signed)
Alan Hernandez, Alan Hernandez NO.:  [id number]  MEDICAL RECORD NO.:  [id number]  LOCATION:  2904                         FACILITY:  [facility]  PHYSICIAN:  Trendon Zaring M. Hernandez, M.D.  DATE OF BIRTH:  Mar 29, 1961  DATE OF ADMISSION:  12/31/2010 DATE OF DISCHARGE:                             HISTORY & PHYSICAL   PRIMARY CARE PHYSICIAN:  None.  PRIMARY CARDIOLOGIST:  None.  CHIEF COMPLAINT:  Chest pain.  HISTORY OF PRESENT ILLNESS:  Alan Hernandez is a 50 year old male with no previous history of coronary artery disease.  He had sudden onset of chest pain at approximately 9:30.  When his symptoms did not resolve, EMS was called.  The chest pain was at 10/10.  It was associated with some nausea and shortness of breath, but no diaphoresis noted.  EMS did an EKG and he had inferior ST elevation.  A code STEMI was called and he was transported emergently to [facility] and taken directly to the cath lab.  He was given aspirin 81 mg x4, sublingual nitroglycerin, and morphine without relief of his pain.  Upon arrival to the cath lab, he was still complaining of pain at a 10/10.  PAST MEDICAL HISTORY:  Alan Hernandez does not had a recent check up.  He specifically denies diabetes, hypertension, or hyperlipidemia.  He has a remote history of diverticulitis more than 8 years ago.  PAST SURGICAL HISTORY:  None.  ALLERGIES:  None.  CURRENT MEDICATIONS:  No prescriptions are noted.  SOCIAL HISTORY:  [place].  He has no history of alcohol, tobacco, or drug abuse.  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  FAMILY HISTORY:  Both his parents are alive in their late 51s and neither one has coronary artery disease.  No siblings have heart disease, but he has grandparents who had heart disease at a young age.  REVIEW OF SYSTEMS:  Essentially negative except as stated in the HPI.  PHYSICAL EXAM:  GENERAL:  He  is a well-developed, well-nourished white male in acute distress. HEENT:  Normal. NECK:  There is no lymphadenopathy, thyromegaly, bruit, or JVD noted. CV:  Heart is regular in rate and rhythm with an S1-S2, and no significant murmur or gallop is noted.  Distal pulses are intact in all four extremities and no femoral bruits are appreciated. LUNGS:  Clear to auscultation bilaterally. SKIN:  No rashes or lesions are noted. ABDOMEN:  Soft, nontender with active bowel sounds. EXTREMITIES:  There is no cyanosis, clubbing, or edema noted. MUSCULOSKELETAL:  There is no joint deformity or effusions, and no spine or CVA tenderness. NEURO:  He is alert and oriented with cranial nerves II through XII grossly intact.  LABORATORY DATA:  EKG, sinus rhythm with inferior ST elevation in leads II, III, and aVF.  Labs and chest x-ray are pending.  IMPRESSION:  Inferior ST elevation myocardial infarction.  Alan Hernandez was evaluated by Dr. Swaziland and the data were reviewed.  He is in the cath lab for an emergent cardiac catheterization with further evaluation and treatment depending on the results.  We will screen for cardiac risk factors and pursue  cardiac risk factor reduction.     Alan Demark, PA-C   ______________________________ Alan Hernandez, M.D.    RB/MEDQ  D:  12/31/2010  T:  12/31/2010  Job:  [id number]  Electronically Signed by Alan Demark PA-C on 02/01/2011 06:32:25 AM Electronically Signed by Alan Hernandez M.D. on 02/02/2011 01:01:46 PM

## 2011-02-03 ENCOUNTER — Encounter (HOSPITAL_COMMUNITY): Payer: BC Managed Care – PPO

## 2011-02-03 ENCOUNTER — Telehealth: Payer: Self-pay | Admitting: *Deleted

## 2011-02-03 NOTE — Telephone Encounter (Signed)
Lm w/lab results. 

## 2011-02-03 NOTE — Telephone Encounter (Signed)
Message copied by Lorayne Bender on Wed Feb 03, 2011  5:51 PM ------      Message from: Rosalio Macadamia      Created: Mon Feb 01, 2011  4:07 PM       Ok to report. Labs are satisfactory.

## 2011-02-05 ENCOUNTER — Encounter (HOSPITAL_COMMUNITY): Payer: BC Managed Care – PPO

## 2011-02-08 ENCOUNTER — Encounter (HOSPITAL_COMMUNITY): Payer: BC Managed Care – PPO

## 2011-02-10 ENCOUNTER — Encounter (HOSPITAL_COMMUNITY): Payer: BC Managed Care – PPO

## 2011-02-12 ENCOUNTER — Encounter (HOSPITAL_COMMUNITY): Payer: BC Managed Care – PPO

## 2011-02-15 ENCOUNTER — Encounter (HOSPITAL_COMMUNITY): Payer: BC Managed Care – PPO

## 2011-02-16 ENCOUNTER — Other Ambulatory Visit: Payer: Self-pay | Admitting: Cardiology

## 2011-02-16 MED ORDER — METOPROLOL TARTRATE 25 MG PO TABS: 25.0000 mg | ORAL_TABLET | Freq: Two times a day (BID) | ORAL | Status: DC

## 2011-02-16 NOTE — Telephone Encounter (Signed)
Called for refill of Lopressor to be sent to [facility] in [place]. Rx sent. Pt notified.

## 2011-02-16 NOTE — Telephone Encounter (Signed)
Lopressor refill needed, pt out needs asap, request call when called in , wants called to  [facility] just this one time, pt [phone]

## 2011-02-17 ENCOUNTER — Encounter (HOSPITAL_COMMUNITY): Payer: BC Managed Care – PPO

## 2011-02-19 ENCOUNTER — Encounter (HOSPITAL_COMMUNITY): Payer: BC Managed Care – PPO

## 2011-02-22 ENCOUNTER — Ambulatory Visit (INDEPENDENT_AMBULATORY_CARE_PROVIDER_SITE_OTHER): Payer: BC Managed Care – PPO | Admitting: Nurse Practitioner

## 2011-02-22 ENCOUNTER — Encounter: Payer: Self-pay | Admitting: Nurse Practitioner

## 2011-02-22 ENCOUNTER — Encounter (HOSPITAL_COMMUNITY): Payer: BC Managed Care – PPO

## 2011-02-22 VITALS — BP 110/80 | HR 60 | Ht 72.0 in | Wt 194.0 lb

## 2011-02-22 DIAGNOSIS — I519 Heart disease, unspecified: Secondary | ICD-10-CM

## 2011-02-22 DIAGNOSIS — I2510 Atherosclerotic heart disease of native coronary artery without angina pectoris: Secondary | ICD-10-CM

## 2011-02-22 MED ORDER — LISINOPRIL 5 MG PO TABS: 5.0000 mg | ORAL_TABLET | Freq: Two times a day (BID) | ORAL | Status: DC

## 2011-02-22 NOTE — Assessment & Plan Note (Signed)
He continues to do well. Metoprolol was increased at his last visit. We will increase the Lisinopril today to BID. New prescription is sent to the drug store. I will have him follow up with Dr. Swaziland in one month per patient's request. Patient is agreeable to this plan and will call if any problems develop in the interim.

## 2011-02-22 NOTE — Assessment & Plan Note (Addendum)
Doing well. To continue with cardiac rehab. Continuing to titrate his medicines. I will have him see Dr. [place]aziland in one month.

## 2011-02-22 NOTE — Progress Notes (Signed)
    Alan Hernandez Date of Birth: 1960-05-23 Medical Record #[id number]  History of Present Illness: Alan Hernandez is seen back today for his 3 week check. He is seen for Dr. [place]. He had a STEMI with DES to the RCA back in September. EF is 45 to 50%. We are trying to up titrate his medicines. He continues to do well. He is in cardiac rehab. Has no real complaints except maybe a little fatigue. No dizziness or lightheadedness. He is tolerating his medicines. No CHF symptoms. He continues to have some chest discomfort with deep inspiration and can "bring it on". This was present prior to his MI and is not like his MI symptoms.   Current Outpatient Prescriptions on File Prior to Visit  Medication Sig Dispense Refill  . aspirin 81 MG tablet Take 81 mg by mouth daily.        . metoprolol tartrate (LOPRESSOR) 25 MG tablet Take 1 tablet (25 mg total) by mouth 2 (two) times daily.  60 tablet  5  . multivitamin (THERAGRAN) per tablet Take 1 tablet by mouth daily.        . nitroGLYCERIN (NITROSTAT) 0.4 MG SL tablet Place 0.4 mg under the tongue every 5 (five) minutes as needed.        . prasugrel (EFFIENT) 10 MG TABS Take 10 mg by mouth daily.        . rosuvastatin (CRESTOR) 40 MG tablet Take 40 mg by mouth daily.        Marland Kitchen DISCONTD: lisinopril (PRINIVIL,ZESTRIL) 5 MG tablet Take 1 tablet (5 mg total) by mouth daily.  30 tablet  11    No Known Allergies  Past Medical History  Diagnosis Date  . STEMI (ST elevation myocardial infarction) Sept 2012    Inferior wall, s/p DES to RCA. EF 45 to 50%  . Coronary artery disease     Status post inferior ST-elevation myocardial infarction treated with a drug-eluting stent to the right coronary artery this admission  . Left ventricular dysfunction Sept 2012    with an ejection fraction of 45-50%  . Dyslipidemia   . Renal insufficiency     Past Surgical History  Procedure Date  . Cardiac catheterization Sept 2012    Ejection fraction was 45-50% --  Single-vessel occlusive atherosclerotic coronary artery disease; Successful percutaneous intervention of the mid right coronary with a drug-eluting stent    History  Smoking status  . Never Smoker   Smokeless tobacco  . Not on file    History  Alcohol Use No    Family History  Problem Relation Age of Onset  . Heart disease      grandparents    Review of Systems: The review of systems is per the HPI.  All other systems were reviewed and are negative.  Physical Exam: BP 110/80  Pulse 60  Ht 6' (1.829 m)  Wt 194 lb (87.998 kg)  BMI 26.31 kg/m2 Patient is alert and in no acute distress. Skin is warm and dry. Color is normal.  HEENT is unremarkable. Normocephalic/atraumatic. PERRL. Sclera are nonicteric. Neck is supple. No masses. No JVD. Lungs are clear. Cardiac exam shows a regular rate and rhythm. Abdomen is soft. Extremities are without edema. Gait and ROM are intact. No gross neurologic deficits noted.   LABORATORY DATA: N/A   Assessment / Plan:

## 2011-02-22 NOTE — Patient Instructions (Signed)
We are going to increase the Lisinopril to 5 mg two times a day  Stay on your other medicines  We will see you back in a month. I will have you see Dr. Swaziland.

## 2011-02-24 ENCOUNTER — Encounter (HOSPITAL_COMMUNITY): Payer: BC Managed Care – PPO

## 2011-02-26 ENCOUNTER — Encounter (HOSPITAL_COMMUNITY): Payer: BC Managed Care – PPO

## 2011-03-01 ENCOUNTER — Encounter (HOSPITAL_COMMUNITY): Payer: BC Managed Care – PPO

## 2011-03-01 DIAGNOSIS — Z5189 Encounter for other specified aftercare: Secondary | ICD-10-CM | POA: Insufficient documentation

## 2011-03-01 DIAGNOSIS — I2510 Atherosclerotic heart disease of native coronary artery without angina pectoris: Secondary | ICD-10-CM | POA: Insufficient documentation

## 2011-03-01 DIAGNOSIS — E785 Hyperlipidemia, unspecified: Secondary | ICD-10-CM | POA: Insufficient documentation

## 2011-03-01 DIAGNOSIS — I2589 Other forms of chronic ischemic heart disease: Secondary | ICD-10-CM | POA: Insufficient documentation

## 2011-03-01 DIAGNOSIS — Z9861 Coronary angioplasty status: Secondary | ICD-10-CM | POA: Insufficient documentation

## 2011-03-01 DIAGNOSIS — I2119 ST elevation (STEMI) myocardial infarction involving other coronary artery of inferior wall: Secondary | ICD-10-CM | POA: Insufficient documentation

## 2011-03-01 DIAGNOSIS — N289 Disorder of kidney and ureter, unspecified: Secondary | ICD-10-CM | POA: Insufficient documentation

## 2011-03-03 ENCOUNTER — Encounter (HOSPITAL_COMMUNITY): Payer: BC Managed Care – PPO

## 2011-03-05 ENCOUNTER — Encounter (HOSPITAL_COMMUNITY): Payer: BC Managed Care – PPO

## 2011-03-08 ENCOUNTER — Encounter (HOSPITAL_COMMUNITY): Payer: BC Managed Care – PPO

## 2011-03-10 ENCOUNTER — Encounter (HOSPITAL_COMMUNITY): Payer: BC Managed Care – PPO

## 2011-03-12 ENCOUNTER — Encounter (HOSPITAL_COMMUNITY)
Admission: RE | Admit: 2011-03-12 | Discharge: 2011-03-12 | Disposition: A | Discharge status: Home / Self Care | Payer: BC Managed Care – PPO | Source: Ambulatory Visit | Attending: Cardiology | Admitting: Cardiology

## 2011-03-15 ENCOUNTER — Encounter (HOSPITAL_COMMUNITY): Payer: BC Managed Care – PPO

## 2011-03-17 ENCOUNTER — Encounter (HOSPITAL_COMMUNITY)
Admission: RE | Admit: 2011-03-17 | Discharge: 2011-03-17 | Disposition: A | Discharge status: Home / Self Care | Payer: BC Managed Care – PPO | Source: Ambulatory Visit | Attending: Cardiology | Admitting: Cardiology

## 2011-03-22 ENCOUNTER — Encounter (HOSPITAL_COMMUNITY): Payer: BC Managed Care – PPO

## 2011-03-24 ENCOUNTER — Encounter (HOSPITAL_COMMUNITY)
Admission: RE | Admit: 2011-03-24 | Discharge: 2011-03-24 | Disposition: A | Discharge status: Home / Self Care | Payer: BC Managed Care – PPO | Source: Ambulatory Visit | Attending: Cardiology | Admitting: Cardiology

## 2011-03-24 NOTE — Progress Notes (Signed)
Belen Zwahlen 50 y.o. male Nutrition Note Spoke with pt.  Nutrition Plan and Nutrition Survey reviewed with pt.   Nutrition Diagnosis   Food-and nutrition-related knowledge deficit related to lack of exposure to information as related to diagnosis of: CVD   Nutrition RX/ Estimated Daily Nutrition Needs for: wt maintenance 2450-2650 Kcal, 80-88 gm fat, 18-20 gm sat fat, 2.7-2.9 gm trans-fat, <1500 mg sodium  Nutrition Intervention   Pt's individual nutrition plan including cholesterol goals reviewed with pt.   Benefits of adopting Therapeutic Lifestyle Changes discussed when Medficts reviewed.   Pt to attend the Portion Distortion class   Pt to attend the  ? Nutrition I class                     ? Nutrition II class   Continue client-centered nutrition education by RD, as part of interdisciplinary care. Goal(s)   Pt to describe the benefit of including fruits, vegetables, whole grains, and low-fat dairy products in a heart healthy meal plan. Monitor and Evaluate progress toward nutrition goal with team.

## 2011-03-26 ENCOUNTER — Encounter (HOSPITAL_COMMUNITY)
Admission: RE | Admit: 2011-03-26 | Discharge: 2011-03-26 | Disposition: A | Discharge status: Home / Self Care | Payer: BC Managed Care – PPO | Source: Ambulatory Visit | Attending: Cardiology | Admitting: Cardiology

## 2011-03-29 ENCOUNTER — Encounter (HOSPITAL_COMMUNITY): Payer: BC Managed Care – PPO

## 2011-03-31 ENCOUNTER — Encounter (HOSPITAL_COMMUNITY): Payer: BC Managed Care – PPO

## 2011-03-31 ENCOUNTER — Ambulatory Visit (INDEPENDENT_AMBULATORY_CARE_PROVIDER_SITE_OTHER): Payer: BC Managed Care – PPO | Admitting: Cardiology

## 2011-03-31 ENCOUNTER — Encounter: Payer: Self-pay | Admitting: Cardiology

## 2011-03-31 VITALS — BP 128/82 | HR 74 | Ht 72.0 in | Wt 199.4 lb

## 2011-03-31 DIAGNOSIS — I10 Essential (primary) hypertension: Secondary | ICD-10-CM

## 2011-03-31 DIAGNOSIS — I2510 Atherosclerotic heart disease of native coronary artery without angina pectoris: Secondary | ICD-10-CM

## 2011-03-31 DIAGNOSIS — I519 Heart disease, unspecified: Secondary | ICD-10-CM

## 2011-03-31 DIAGNOSIS — E785 Hyperlipidemia, unspecified: Secondary | ICD-10-CM | POA: Insufficient documentation

## 2011-03-31 MED ORDER — PRASUGREL HCL 10 MG PO TABS: 10.0000 mg | ORAL_TABLET | Freq: Every day | ORAL | Status: DC

## 2011-03-31 NOTE — Patient Instructions (Signed)
Continue your current medications and keep up with your exercise and heart healthy diet.  We will schedule you for fasting lab work.  I will see you again in 6 months.

## 2011-03-31 NOTE — Assessment & Plan Note (Signed)
We will schedule him for fasting lab work since he has not had his lipids reassessed since his heart attack. He will continue on high-dose Crestor therapy.

## 2011-03-31 NOTE — Progress Notes (Signed)
    Alan Hernandez Date of Birth: 1961-02-15 Medical Record #[id number]  History of Present Illness: Alan Hernandez is seen back today for a followup visit. He had a STEMI with DES to the RCA back in September. EF is 45 to 50%.  He continues to do well. He is in cardiac rehab. Has no real complaints except maybe a little fatigue. No dizziness or lightheadedness. He is tolerating his medicines. No CHF symptoms. He states he is following a heart healthy diet. He does not smoke.  Current Outpatient Prescriptions on File Prior to Visit  Medication Sig Dispense Refill  . aspirin 81 MG tablet Take 81 mg by mouth daily.        Marland Kitchen lisinopril (PRINIVIL,ZESTRIL) 5 MG tablet Take 1 tablet (5 mg total) by mouth 2 (two) times daily.  30 tablet  11  . metoprolol tartrate (LOPRESSOR) 25 MG tablet Take 1 tablet (25 mg total) by mouth 2 (two) times daily.  60 tablet  5  . multivitamin (THERAGRAN) per tablet Take 1 tablet by mouth daily.        . nitroGLYCERIN (NITROSTAT) 0.4 MG SL tablet Place 0.4 mg under the tongue every 5 (five) minutes as needed.        . rosuvastatin (CRESTOR) 40 MG tablet Take 40 mg by mouth daily.        Marland Kitchen DISCONTD: prasugrel (EFFIENT) 10 MG TABS Take 10 mg by mouth daily.          No Known Allergies  Past Medical History  Diagnosis Date  . STEMI (ST elevation myocardial infarction) Sept 2012    Inferior wall, s/p DES to RCA. EF 45 to 50%  . Coronary artery disease     Status post inferior ST-elevation myocardial infarction treated with a drug-eluting stent to the right coronary artery this admission  . Left ventricular dysfunction Sept 2012    with an ejection fraction of 45-50%  . Dyslipidemia   . Renal insufficiency     Past Surgical History  Procedure Date  . Cardiac catheterization Sept 2012    Ejection fraction was 45-50% -- Single-vessel occlusive atherosclerotic coronary artery disease; Successful percutaneous intervention of the mid right coronary with a drug-eluting  stent    History  Smoking status  . Never Smoker   Smokeless tobacco  . Not on file    History  Alcohol Use No    Family History  Problem Relation Age of Onset  . Heart disease      grandparents    Review of Systems: The review of systems is per the HPI.  All other systems were reviewed and are negative.  Physical Exam: BP 128/82  Pulse 74  Ht 6' (1.829 m)  Wt 90.447 kg (199 lb 6.4 oz)  BMI 27.04 kg/m2 Patient is alert and in no acute distress. Skin is warm and dry. Color is normal.  HEENT is unremarkable. Normocephalic/atraumatic. PERRL. Sclera are nonicteric. Neck is supple. No masses. No JVD. Lungs are clear. Cardiac exam shows a regular rate and rhythm. Abdomen is soft. Extremities are without edema. Gait and ROM are intact. No gross neurologic deficits noted.   LABORATORY DATA:    Assessment / Plan:

## 2011-03-31 NOTE — Assessment & Plan Note (Signed)
No recurrent anginal symptoms. I stressed the importance of lifestyle modification and continuing on his medications. He needs dual antiplatelet therapy for one year.

## 2011-03-31 NOTE — Assessment & Plan Note (Signed)
No evidence of congestive heart failure. His LV dysfunction is only mild. He is on appropriate therapy with beta blocker and ACE inhibitor. We will continue on his current therapy.

## 2011-04-02 ENCOUNTER — Encounter (HOSPITAL_COMMUNITY)
Admission: RE | Admit: 2011-04-02 | Discharge: 2011-04-02 | Disposition: A | Discharge status: Home / Self Care | Payer: BC Managed Care – PPO | Source: Ambulatory Visit | Attending: Cardiology | Admitting: Cardiology

## 2011-04-02 DIAGNOSIS — E785 Hyperlipidemia, unspecified: Secondary | ICD-10-CM | POA: Insufficient documentation

## 2011-04-02 DIAGNOSIS — Z5189 Encounter for other specified aftercare: Secondary | ICD-10-CM | POA: Insufficient documentation

## 2011-04-02 DIAGNOSIS — N289 Disorder of kidney and ureter, unspecified: Secondary | ICD-10-CM | POA: Insufficient documentation

## 2011-04-02 DIAGNOSIS — I2510 Atherosclerotic heart disease of native coronary artery without angina pectoris: Secondary | ICD-10-CM | POA: Insufficient documentation

## 2011-04-02 DIAGNOSIS — I2589 Other forms of chronic ischemic heart disease: Secondary | ICD-10-CM | POA: Insufficient documentation

## 2011-04-02 DIAGNOSIS — Z9861 Coronary angioplasty status: Secondary | ICD-10-CM | POA: Insufficient documentation

## 2011-04-02 DIAGNOSIS — I2119 ST elevation (STEMI) myocardial infarction involving other coronary artery of inferior wall: Secondary | ICD-10-CM | POA: Insufficient documentation

## 2011-04-05 ENCOUNTER — Encounter (HOSPITAL_COMMUNITY): Payer: BC Managed Care – PPO

## 2011-04-07 ENCOUNTER — Encounter (HOSPITAL_COMMUNITY): Payer: BC Managed Care – PPO

## 2011-04-09 ENCOUNTER — Encounter (HOSPITAL_COMMUNITY): Payer: BC Managed Care – PPO

## 2011-04-12 ENCOUNTER — Encounter (HOSPITAL_COMMUNITY): Payer: BC Managed Care – PPO

## 2011-04-14 ENCOUNTER — Encounter (HOSPITAL_COMMUNITY): Payer: BC Managed Care – PPO

## 2011-04-15 ENCOUNTER — Other Ambulatory Visit (INDEPENDENT_AMBULATORY_CARE_PROVIDER_SITE_OTHER): Payer: BC Managed Care – PPO | Admitting: *Deleted

## 2011-04-15 DIAGNOSIS — I10 Essential (primary) hypertension: Secondary | ICD-10-CM

## 2011-04-15 DIAGNOSIS — E785 Hyperlipidemia, unspecified: Secondary | ICD-10-CM

## 2011-04-15 LAB — LIPID PANEL
Cholesterol: 80 mg/dL (ref 0–200)
Triglycerides: 62 mg/dL (ref 0.0–149.0)

## 2011-04-15 LAB — BASIC METABOLIC PANEL
BUN: 10 mg/dL (ref 6–23)
CO2: 29 mEq/L (ref 19–32)
Chloride: 102 mEq/L (ref 96–112)
Glucose, Bld: 90 mg/dL (ref 70–99)
Potassium: 4.4 mEq/L (ref 3.5–5.1)

## 2011-04-15 LAB — HEPATIC FUNCTION PANEL
ALT: 39 U/L (ref 0–53)
AST: 30 U/L (ref 0–37)
Albumin: 3.8 g/dL (ref 3.5–5.2)
Total Protein: 7 g/dL (ref 6.0–8.3)

## 2011-04-16 ENCOUNTER — Encounter (HOSPITAL_COMMUNITY): Payer: BC Managed Care – PPO

## 2011-04-21 ENCOUNTER — Encounter (HOSPITAL_COMMUNITY): Payer: BC Managed Care – PPO

## 2011-04-21 NOTE — Progress Notes (Signed)
lmtcb

## 2011-04-23 ENCOUNTER — Encounter (HOSPITAL_COMMUNITY): Payer: BC Managed Care – PPO

## 2011-04-26 ENCOUNTER — Encounter (HOSPITAL_COMMUNITY): Payer: BC Managed Care – PPO

## 2011-04-28 ENCOUNTER — Encounter (HOSPITAL_COMMUNITY): Payer: BC Managed Care – PPO

## 2011-04-30 ENCOUNTER — Encounter (HOSPITAL_COMMUNITY): Payer: BC Managed Care – PPO

## 2011-05-03 ENCOUNTER — Encounter (HOSPITAL_COMMUNITY): Payer: BC Managed Care – PPO

## 2011-05-04 NOTE — Progress Notes (Signed)
Cardiac Rehabilitation Program Progress Report   Orientation:  01/28/2011 Discharge Date:  04/16/2011 # of sessions completed: 17/36  Cardiologist: [place] Family MD:   Class Time:  1115  A.  Exercise Program:  Tolerates 6.4 METs for 30 min, Bike Test Results:  Pre: 0.98 miles, Poor attendance due to work schedule and travel and Discharged to home exercise program.  Anticipated compliance:  excellent  B.  Mental Health:  Good mental attitude Initial QOL scores: Overall 16.02, Health and Functioning 12.70, Socioeconomic 14.57, Psychological/Spiritual 19.50, Family 24.88  C.  Education/Instruction/Skills  Accurately checks own pulse.  Rest:  85  Exercise:  136, Knows THR for exercise, Uses Perceived Exertion Scale and/or Dyspnea Scale and Attended 3/13 education classes  Home exercise given: 02/05/2011  D.  Nutrition/Weight Control/Body Composition:    *This section completed by Mickle Plumb, Andres Shad, RD, LDN, CDE  E.  Blood Lipids    Lab Results  Component Value Date   CHOL 80 04/15/2011     Lab Results  Component Value Date   TRIG 62.0 04/15/2011     Lab Results  Component Value Date   HDL 30.70* 04/15/2011     Lab Results  Component Value Date   CHOLHDL 3 04/15/2011     No results found for this basename: LDLDIRECT      F.  Lifestyle Changes:  Making positive lifestyle changes  G.  Symptoms noted with exercise:  Asymptomatic  Report Completed By:  Hazle Nordmann   Comments:  Pt dropped from program since insurance benefits were restarting, as well as his work/travel schedule.  Pt did well in program while here progressing from 3.7 METs to 6.4 METs.  Pt plans to continue to exercise by returning to gym for lifting routine and using the elliptical.  At discharge pt rhythm was sinus.  Thanks for the referral. Fabio Pierce, MA, ACSM RCEP

## 2011-05-05 ENCOUNTER — Encounter (HOSPITAL_COMMUNITY): Payer: BC Managed Care – PPO

## 2011-05-05 NOTE — Progress Notes (Addendum)
Cardiac Rehabilitation Program Progress Report   Orientation:  01/28/2011 Discharge Date:  04/16/2011 # of sessions completed: 17/36  Cardiologist: [place] Family MD:   Class Time:  1115  A.  Exercise Program:  Tolerates 6.4 METs for 30 min, Bike Test Results:  Pre: 0.98 miles, Poor attendance due to work schedule and travel and Discharged to home exercise program.  Anticipated compliance:  excellent  B.  Mental Health:  Good mental attitude Initial QOL scores: Overall 16.02, Health and Functioning 12.70, Socioeconomic 14.57, Psychological/Spiritual 19.50, Family 24.88  C.  Education/Instruction/Skills  Accurately checks own pulse.  Rest:  85  Exercise:  136, Knows THR for exercise, Uses Perceived Exertion Scale and/or Dyspnea Scale and Attended 3/13 education classes  Home exercise given: 02/05/2011  D.  Nutrition/Weight Control/Body Composition: Pt following a step 2 heart healthy diet, Pt with wt gain of 2.5 kg    *This section completed by Mickle Plumb, Andres Shad, RD, LDN, CDE  E.  Blood Lipids Lab Results  Component Value Date   CHOL 80 04/15/2011   HDL 30.70* 04/15/2011   LDLCALC 37 04/15/2011   TRIG 62.0 04/15/2011   CHOLHDL 3 04/15/2011   F.  Lifestyle Changes:  Making positive lifestyle changes  G.  Symptoms noted with exercise:  Asymptomatic  Report Completed By:  Jacques Earthly   Comments:  Pt dropped from program since insurance benefits were restarting, as well as his work/travel schedule.  Pt did well in program while here progressing from 3.7 METs to 6.4 METs.  Pt plans to continue to exercise by returning to gym for lifting routine and using the elliptical.  At discharge pt rhythm was sinus.  Thanks for the referral. Fabio Pierce, MA, ACSM RCEP    Agree with the above comments.  Unable to reconcile medications, pt did not return to exercise his last day due to cold symptoms Karlene Lineman RN

## 2011-05-07 ENCOUNTER — Encounter (HOSPITAL_COMMUNITY): Payer: BC Managed Care – PPO

## 2011-08-16 ENCOUNTER — Ambulatory Visit (INDEPENDENT_AMBULATORY_CARE_PROVIDER_SITE_OTHER): Payer: BC Managed Care – PPO | Admitting: Cardiology

## 2011-08-16 ENCOUNTER — Encounter: Payer: Self-pay | Admitting: Cardiology

## 2011-08-16 DIAGNOSIS — E785 Hyperlipidemia, unspecified: Secondary | ICD-10-CM

## 2011-08-16 DIAGNOSIS — I2510 Atherosclerotic heart disease of native coronary artery without angina pectoris: Secondary | ICD-10-CM

## 2011-08-16 DIAGNOSIS — I519 Heart disease, unspecified: Secondary | ICD-10-CM

## 2011-08-16 DIAGNOSIS — R079 Chest pain, unspecified: Secondary | ICD-10-CM

## 2011-08-16 DIAGNOSIS — I252 Old myocardial infarction: Secondary | ICD-10-CM

## 2011-08-16 NOTE — Assessment & Plan Note (Signed)
His lipid levels are at goal on high-dose Crestor except for low HDL. Continue aerobic exercise.

## 2011-08-16 NOTE — Progress Notes (Signed)
Alan Hernandez Date of Birth: [birth year] Medical Record #[id number]  History of Present Illness: Alan Hernandez is seen back today for a followup visit. He had a STEMI with DES to the RCA back in September. EF is 45 to 50%.  He reports he is doing well. He has had 2 mild episodes of chest pain a couple of weeks ago. He describes this as a discomfort in his chest and a sensation that he couldn't catch his breath. This was momentary sensation but that the chest discomfort lasted longer. These both occurred at rest. He's had no difficulty with exertion. He has been compliant with his medications but does admit that he misses a dose on occasion. He reports that he is walking regularly but has not resumed weight lifting.  Current Outpatient Prescriptions on File Prior to Visit  Medication Sig Dispense Refill  . aspirin 81 MG tablet Take 81 mg by mouth daily.        Marland Kitchen lisinopril (PRINIVIL,ZESTRIL) 5 MG tablet Take 1 tablet (5 mg total) by mouth 2 (two) times daily.  30 tablet  11  . metoprolol tartrate (LOPRESSOR) 25 MG tablet Take 1 tablet (25 mg total) by mouth 2 (two) times daily.  60 tablet  5  . multivitamin (THERAGRAN) per tablet Take 1 tablet by mouth daily.        . nitroGLYCERIN (NITROSTAT) 0.4 MG SL tablet Place 0.4 mg under the tongue every 5 (five) minutes as needed.        . prasugrel (EFFIENT) 10 MG TABS Take 1 tablet (10 mg total) by mouth daily.  90 tablet  3  . rosuvastatin (CRESTOR) 40 MG tablet Take 40 mg by mouth daily.          No Known Allergies  Past Medical History  Diagnosis Date  . STEMI (ST elevation myocardial infarction) Sept 2012    Inferior wall, s/p DES to RCA. EF 45 to 50%  . Coronary artery disease     Status post inferior ST-elevation myocardial infarction treated with a drug-eluting stent to the right coronary artery this admission  . Left ventricular dysfunction Sept 2012    with an ejection fraction of 45-50%  . Dyslipidemia   . Renal insufficiency      Past Surgical History  Procedure Date  . Cardiac catheterization Sept 2012    Ejection fraction was 45-50% -- Single-vessel occlusive atherosclerotic coronary artery disease; Successful percutaneous intervention of the mid right coronary with a drug-eluting stent    History  Smoking status  . Never Smoker   Smokeless tobacco  . Not on file    History  Alcohol Use No    Family History  Problem Relation Age of Onset  . Heart disease      grandparents    Review of Systems: The review of systems is per the HPI. He does have a nonhealing skin lesion on his back in the left posterior rib area. All other systems were reviewed and are negative.  Physical Exam: BP 122/78  Pulse 76  Ht 6' (1.829 m)  Wt 204 lb (92.534 kg)  BMI 27.67 kg/m2 Patient is alert and in no acute distress. Skin is warm and dry. He does have a superficial ulceration on his back in the left posterior thorax. There are some small pustules around it. Color is normal.  HEENT is unremarkable. Normocephalic/atraumatic. PERRL. Sclera are nonicteric. Neck is supple. No masses. No JVD. Lungs are clear. Cardiac exam shows a regular rate  and rhythm. Abdomen is soft. Extremities are without edema. Gait and ROM are intact. No gross neurologic deficits noted.   LABORATORY DATA:  Lab Results  Component Value Date   CHOL 80 04/15/2011   HDL 30.70* 04/15/2011   LDLCALC 37 04/15/2011   TRIG 62.0 04/15/2011   CHOLHDL 3 04/15/2011     Assessment / Plan:

## 2011-08-16 NOTE — Assessment & Plan Note (Signed)
His recent chest symptoms are atypical. Reviewing his cardiac catheterization data he had a single obstructive lesion in the mid right coronary. This is a short lesion and was stented with a 4.0 mm stent. I think the risk of restenosis in this lesion is quite low. We will take a watchful waiting approach. If he does have recurrent chest pain symptoms we may need to consider stress testing. I have strongly reinforced the need for dual antiplatelet therapy for one year. At that point we will stop his Effient.

## 2011-08-16 NOTE — Assessment & Plan Note (Signed)
Mild left ventricular dysfunction. We will continue with metoprolol and ACE inhibitor. I will followup again in 6 months.

## 2011-08-16 NOTE — Patient Instructions (Signed)
Continue your current medication.  Call me if you have any more chest pain. We would consider a stress test if you do.  I will see you again in 6 months with fasting lab work.

## 2011-08-17 ENCOUNTER — Other Ambulatory Visit: Payer: Self-pay | Admitting: Cardiology

## 2011-09-15 ENCOUNTER — Other Ambulatory Visit: Payer: Self-pay | Admitting: Nurse Practitioner

## 2012-03-11 ENCOUNTER — Other Ambulatory Visit: Payer: Self-pay | Admitting: Physician Assistant

## 2012-07-04 ENCOUNTER — Other Ambulatory Visit: Payer: Self-pay | Admitting: Cardiology

## 2012-07-05 NOTE — Telephone Encounter (Signed)
Called and tried to leave a message for patient to schedule a follow up appointment to receive further refills but voicemail was full.

## 2012-08-22 IMAGING — CR DG CHEST 1V PORT
1 series · 1 of 1 positions shown · non-contrast
Comparison: None.

CLINICAL DATA: Cardiac catheterization.

PORTABLE CHEST - 1 VIEW

[view not recorded]
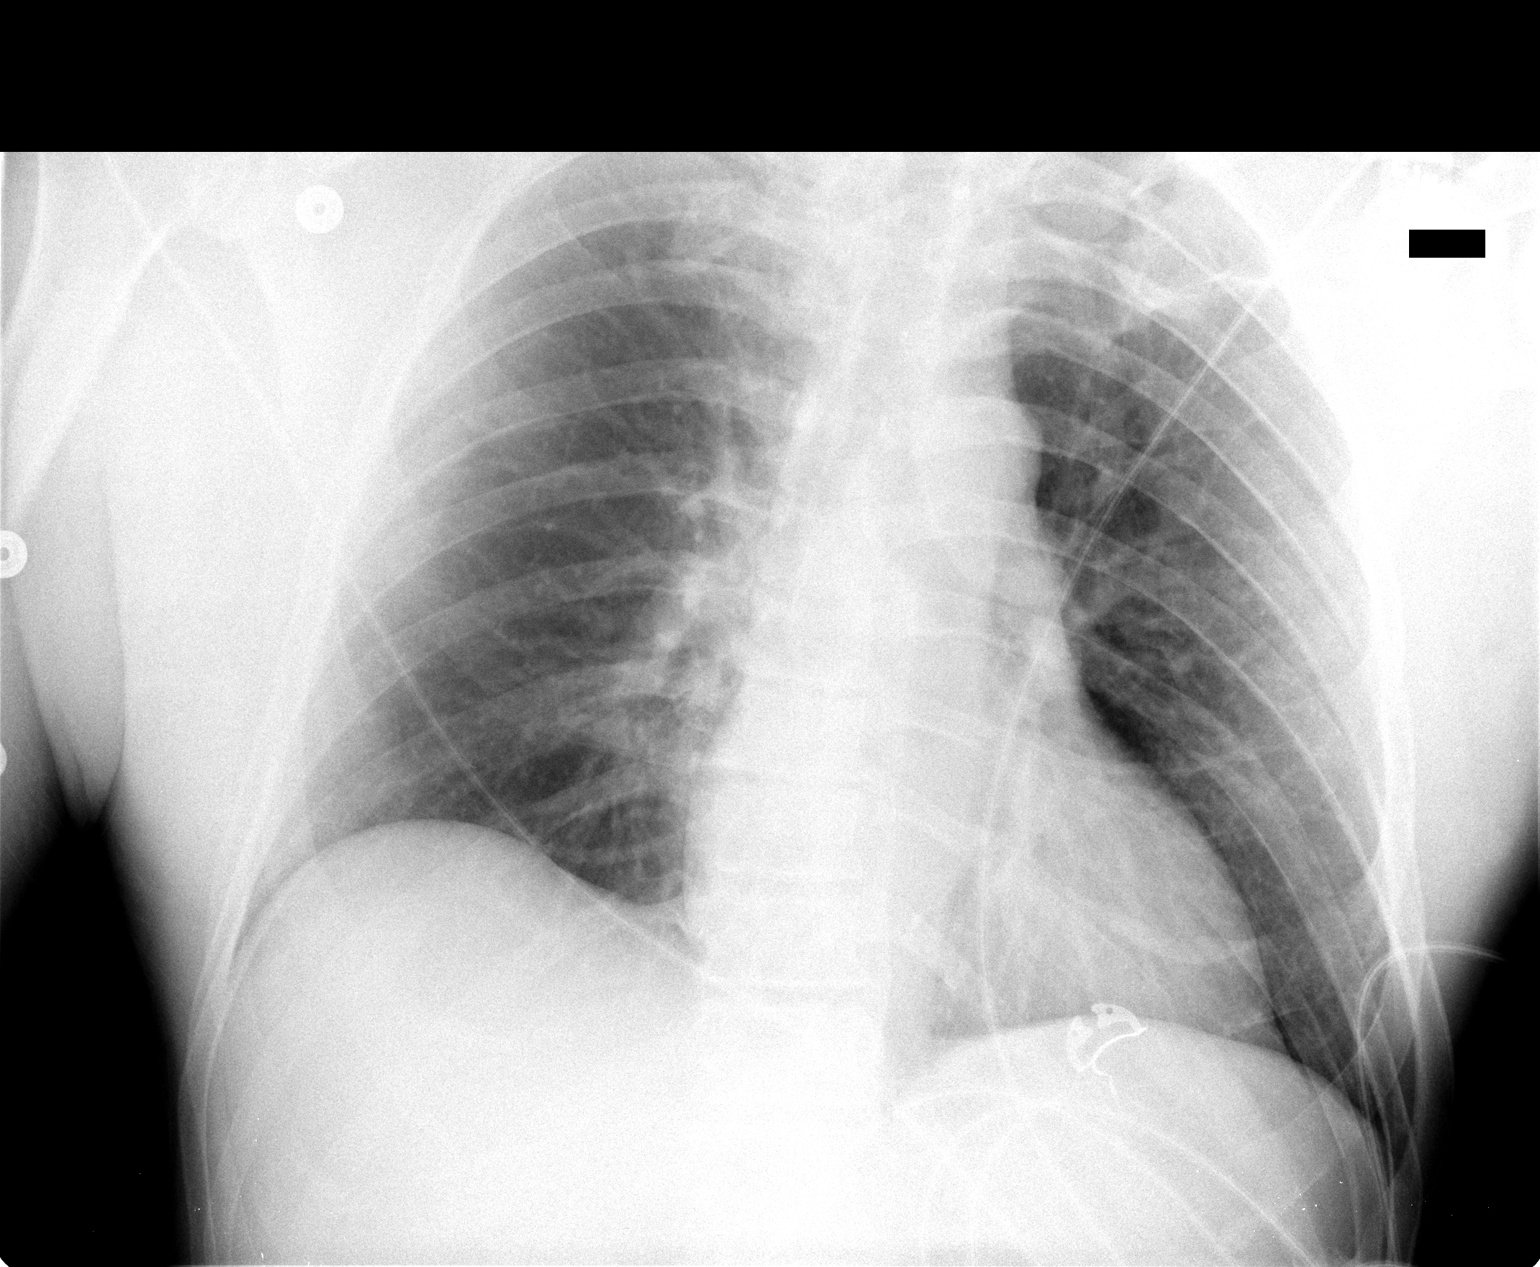

[1 of 1 positions shown; findings below may reference images not displayed]

FINDINGS: The heart size and vascularity are normal and the lungs
are clear.  No osseous abnormality.
IMPRESSION: Normal chest.

## 2015-12-12 ENCOUNTER — Emergency Department (HOSPITAL_COMMUNITY)

## 2015-12-12 ENCOUNTER — Inpatient Hospital Stay (HOSPITAL_COMMUNITY): Admission: EM | Admit: 2015-12-12 | Discharge: 2015-12-16 | DRG: 287 | Attending: Cardiology | Admitting: Cardiology

## 2015-12-12 ENCOUNTER — Encounter (HOSPITAL_COMMUNITY): Payer: Self-pay | Admitting: Emergency Medicine

## 2015-12-12 DIAGNOSIS — I25700 Atherosclerosis of coronary artery bypass graft(s), unspecified, with unstable angina pectoris: Secondary | ICD-10-CM | POA: Diagnosis not present

## 2015-12-12 DIAGNOSIS — I252 Old myocardial infarction: Secondary | ICD-10-CM

## 2015-12-12 DIAGNOSIS — I25110 Atherosclerotic heart disease of native coronary artery with unstable angina pectoris: Principal | ICD-10-CM | POA: Diagnosis present

## 2015-12-12 DIAGNOSIS — R079 Chest pain, unspecified: Secondary | ICD-10-CM

## 2015-12-12 DIAGNOSIS — E785 Hyperlipidemia, unspecified: Secondary | ICD-10-CM | POA: Diagnosis present

## 2015-12-12 DIAGNOSIS — L989 Disorder of the skin and subcutaneous tissue, unspecified: Secondary | ICD-10-CM | POA: Diagnosis present

## 2015-12-12 DIAGNOSIS — Z955 Presence of coronary angioplasty implant and graft: Secondary | ICD-10-CM

## 2015-12-12 DIAGNOSIS — Z85820 Personal history of malignant melanoma of skin: Secondary | ICD-10-CM

## 2015-12-12 DIAGNOSIS — Z7982 Long term (current) use of aspirin: Secondary | ICD-10-CM

## 2015-12-12 LAB — BASIC METABOLIC PANEL
Anion gap: 4 — ABNORMAL LOW (ref 5–15)
BUN: 6 mg/dL (ref 6–20)
CHLORIDE: 109 mmol/L (ref 101–111)
CO2: 26 mmol/L (ref 22–32)
CREATININE: 1.01 mg/dL (ref 0.61–1.24)
Calcium: 10.1 mg/dL (ref 8.9–10.3)
Glucose, Bld: 113 mg/dL — ABNORMAL HIGH (ref 65–99)
POTASSIUM: 4.1 mmol/L (ref 3.5–5.1)
SODIUM: 139 mmol/L (ref 135–145)

## 2015-12-12 LAB — CBC
HEMATOCRIT: 38.2 % — AB (ref 39.0–52.0)
Hemoglobin: 12.9 g/dL — ABNORMAL LOW (ref 13.0–17.0)
MCH: 31.6 pg (ref 26.0–34.0)
MCHC: 33.8 g/dL (ref 30.0–36.0)
MCV: 93.6 fL (ref 78.0–100.0)
PLATELETS: 260 10*3/uL (ref 150–400)
RBC: 4.08 MIL/uL — AB (ref 4.22–5.81)
RDW: 13 % (ref 11.5–15.5)
WBC: 6.1 10*3/uL (ref 4.0–10.5)

## 2015-12-12 LAB — I-STAT TROPONIN, ED: Troponin i, poc: 0.01 ng/mL (ref 0.00–0.08)

## 2015-12-12 LAB — TROPONIN I: Troponin I: <0.03 ng/mL (ref <0.03)

## 2015-12-12 MED ORDER — ADULT MULTIVITAMIN LIQUID CH
Freq: Every day | ORAL | Status: DC
  Administered 2015-12-13: 15 mL via ORAL
  Filled 2015-12-12 (×2): qty 15

## 2015-12-12 MED ORDER — NITROGLYCERIN 2 % TD OINT
1.0000 [in_us] | TOPICAL_OINTMENT | Freq: Four times a day (QID) | TRANSDERMAL | Status: DC
  Administered 2015-12-12 – 2015-12-15 (×10): 1 [in_us] via TOPICAL
  Filled 2015-12-12: qty 30
  Filled 2015-12-12: qty 1

## 2015-12-12 MED ORDER — ZOLPIDEM TARTRATE 5 MG PO TABS: 5.0000 mg | ORAL_TABLET | Freq: Every evening | ORAL | Status: DC | PRN

## 2015-12-12 MED ORDER — ONDANSETRON HCL 4 MG/2ML IJ SOLN: 4.0000 mg | Freq: Four times a day (QID) | INTRAMUSCULAR | Status: DC | PRN

## 2015-12-12 MED ORDER — SODIUM CHLORIDE 0.9% FLUSH: 3.0000 mL | INTRAVENOUS | Status: DC | PRN

## 2015-12-12 MED ORDER — SODIUM CHLORIDE 0.9% FLUSH
3.0000 mL | Freq: Two times a day (BID) | INTRAVENOUS | Status: DC
  Administered 2015-12-12 – 2015-12-16 (×5): 3 mL via INTRAVENOUS

## 2015-12-12 MED ORDER — ALPRAZOLAM 0.25 MG PO TABS: 0.2500 mg | ORAL_TABLET | Freq: Two times a day (BID) | ORAL | Status: DC | PRN

## 2015-12-12 MED ORDER — ASPIRIN EC 81 MG PO TBEC
81.0000 mg | DELAYED_RELEASE_TABLET | Freq: Every day | ORAL | Status: DC
  Administered 2015-12-13 – 2015-12-14 (×2): 81 mg via ORAL
  Filled 2015-12-12 (×2): qty 1

## 2015-12-12 MED ORDER — SIMVASTATIN 20 MG PO TABS
20.0000 mg | ORAL_TABLET | Freq: Every day | ORAL | Status: DC
  Administered 2015-12-13 – 2015-12-15 (×3): 20 mg via ORAL
  Filled 2015-12-12 (×3): qty 1

## 2015-12-12 MED ORDER — ACETAMINOPHEN 325 MG PO TABS: 650.0000 mg | ORAL_TABLET | ORAL | Status: DC | PRN

## 2015-12-12 MED ORDER — METOPROLOL TARTRATE 50 MG PO TABS
75.0000 mg | ORAL_TABLET | Freq: Two times a day (BID) | ORAL | Status: DC
  Administered 2015-12-12 – 2015-12-16 (×7): 75 mg via ORAL
  Filled 2015-12-12 (×7): qty 1

## 2015-12-12 MED ORDER — ASPIRIN EC 81 MG PO TBEC
81.0000 mg | DELAYED_RELEASE_TABLET | Freq: Every day | ORAL | Status: DC
  Administered 2015-12-15 – 2015-12-16 (×2): 81 mg via ORAL
  Filled 2015-12-12 (×2): qty 1

## 2015-12-12 MED ORDER — NITROGLYCERIN 0.4 MG SL SUBL
0.4000 mg | SUBLINGUAL_TABLET | SUBLINGUAL | Status: DC | PRN
  Administered 2015-12-13 (×2): 0.4 mg via SUBLINGUAL
  Filled 2015-12-12 (×3): qty 1

## 2015-12-12 MED ORDER — SODIUM CHLORIDE 0.9 % IV SOLN: 250.0000 mL | INTRAVENOUS | Status: DC | PRN

## 2015-12-12 MED ORDER — ENOXAPARIN SODIUM 40 MG/0.4ML ~~LOC~~ SOLN
40.0000 mg | SUBCUTANEOUS | Status: DC
  Administered 2015-12-12 – 2015-12-14 (×3): 40 mg via SUBCUTANEOUS
  Filled 2015-12-12 (×3): qty 0.4

## 2015-12-12 MED ORDER — AMLODIPINE BESYLATE 10 MG PO TABS
10.0000 mg | ORAL_TABLET | Freq: Every day | ORAL | Status: DC
  Administered 2015-12-13 – 2015-12-16 (×4): 10 mg via ORAL
  Filled 2015-12-12 (×4): qty 1

## 2015-12-12 NOTE — Progress Notes (Addendum)
Called update to prison nurse per law enforcement offer request. All questions answered.   Leonidas Rombergaitlin S Bumbledare, RN

## 2015-12-12 NOTE — Progress Notes (Signed)
Pt received from ED. Pt and law enforcement at bedside oriented to room and equipment. Pt denies chest pain. VSS. Telemetry applied, CCMD notified x2. Call light within reach, dinner tray ordered.  Leonidas Rombergaitlin S Bumbledare, RN

## 2015-12-12 NOTE — ED Triage Notes (Signed)
Pt arrives by EMS in police company. After eating lunch, pt c/o shooting pain in the center going to the right. 4 years ago hx of MI with 1 stent placement and ablation x2. Pt c/o queasiness, no vomiting. Mild diaphoresis. Tachy initially 140s, no 90s. pvcs on ems ekg. 324 asa given by ems and 2 nitro. Pain has gone down, pain started at 9/10, now 4/10. Pt in NAD, AAOX4. Pt in handcuffs with [place] sheriff at bedside.

## 2015-12-12 NOTE — ED Notes (Signed)
Attempted report 

## 2015-12-12 NOTE — ED Notes (Signed)
Pt eating meal tray 

## 2015-12-12 NOTE — ED Provider Notes (Signed)
[facility]-EMERGENCY DEPT Provider Note   CSN: [id number] Arrival date & time: 12/12/15  1345     History   Chief Complaint Chief Complaint  Patient presents with  . Chest Pain    HPI Alan Hernandez is a 55 y.o. male.  HPI   55 year old male with history of MI status post DES to RCA and ablation 2 right here via EMS from the jail house for evaluation of chest pain. Pt is currently incarcerated, and accompany by to [employer] to the ER.  Patient report for the past several weeks he has had intermittent chest discomfort lasting for several hours and resolved with rest. Today after eating lunch, he developed sharp pressure pain across his chest, became diaphoretic, lightheadedness, shortness of breath, felt nauseous. Also reported pain to his low back which is new. States pain feels similar to prior MI. Pain is been ongoing for the past 2-3 hours. He was brought here via EMS and received aspirin as well as sublingual nitroglycerin glycerin which brought his pain from an 8 out of 10 to a 4 out of 10. Patient states he feels better. He denies having fever, headache, productive cough, neck pain, abdominal pain, or pain in his arms or legs. Denies increasing stress or any recent strenuous activity.  Past Medical History:  Diagnosis Date  . Coronary artery disease    Status post inferior ST-elevation myocardial infarction treated with a drug-eluting stent to the right coronary artery this admission  . Dyslipidemia   . Left ventricular dysfunction Sept 2012   with an ejection fraction of 45-50%  . Renal insufficiency   . STEMI (ST elevation myocardial infarction) [facility]) Sept 2012   Inferior wall, s/p DES to RCA. EF 45 to 50%    Patient Active Problem List   Diagnosis Date Noted  . Dyslipidemia 03/31/2011  . CAD (coronary artery disease) 01/18/2011  . Left ventricular dysfunction 01/18/2011    Past Surgical History:  Procedure Laterality Date  . CARDIAC CATHETERIZATION  Sept 2012   Ejection  fraction was 45-50% -- Single-vessel occlusive atherosclerotic coronary artery disease; Successful percutaneous intervention of the mid right coronary with a drug-eluting stent       Home Medications    Prior to Admission medications   Medication Sig Start Date End Date Taking? Authorizing Provider  amLODipine (NORVASC) 10 MG tablet Take 10 mg by mouth daily.   Yes Historical Provider, MD  aspirin 81 MG tablet Take 81 mg by mouth daily.     Yes Historical Provider, MD  metoprolol (LOPRESSOR) 50 MG tablet Take 75 mg by mouth 2 (two) times daily.   Yes Historical Provider, MD  Multiple Vitamins-Minerals (MULTIVITAMIN PO) Take 1 tablet by mouth daily.   Yes Historical Provider, MD  nitroGLYCERIN (NITROSTAT) 0.4 MG SL tablet Place 0.4 mg under the tongue every 5 (five) minutes as needed for chest pain.    Yes Historical Provider, MD  simvastatin (ZOCOR) 20 MG tablet Take 20 mg by mouth daily.   Yes Historical Provider, MD  CRESTOR 40 MG tablet TAKE ONE TABLET BY MOUTH EVERY DAY AT BEDTIME Patient not taking: Reported on 12/12/2015 03/11/12   Peter M Swaziland, MD  lisinopril (PRINIVIL,ZESTRIL) 5 MG tablet TAKE ONE TABLET BY MOUTH TWICE DAILY Patient not taking: Reported on 12/12/2015 07/04/12   Peter M [place], MD  metoprolol tartrate (LOPRESSOR) 25 MG tablet TAKE ONE TABLET BY MOUTH TWICE DAILY Patient not taking: Reported on 12/12/2015 07/04/12   Peter M [place], MD  prasugrel (EFFIENT)  10 MG TABS Take 1 tablet (10 mg total) by mouth daily. Patient not taking: Reported on 12/12/2015 03/31/11   Peter M SwazilandJordan, MD    Family History Family History  Problem Relation Age of Onset  . Heart disease      grandparents    Social History Social History  Substance Use Topics  . Smoking status: Never Smoker  . Smokeless tobacco: Not on file  . Alcohol use No     Allergies   Review of patient's allergies indicates no known allergies.   Review of Systems Review of Systems  All other systems  reviewed and are negative.    Physical Exam Updated Vital Signs BP 123/71 (BP Location: Right Arm)   Pulse 91   Temp 98.6 F (37 C) (Oral)   Resp 18   SpO2 97%   Physical Exam  Constitutional: He appears well-developed and well-nourished. No distress.  HENT:  Head: Atraumatic.  Eyes: Conjunctivae are normal.  Neck: Neck supple. No JVD present.  Cardiovascular: Normal rate, regular rhythm and intact distal pulses.   Pulmonary/Chest: Effort normal and breath sounds normal. No respiratory distress. He exhibits no tenderness.  Abdominal: Soft. There is tenderness (Mild epigastric tenderness without guarding or rebound tenderness).  Musculoskeletal: He exhibits no edema.  Neurological: He is alert.  Skin: Capillary refill takes less than 2 seconds. No rash noted.  Psychiatric: He has a normal mood and affect.  Nursing note and vitals reviewed.    ED Treatments / Results  Labs (all labs ordered are listed, but only abnormal results are displayed) Labs Reviewed  BASIC METABOLIC PANEL - Abnormal; Notable for the following:       Result Value   Glucose, Bld 113 (*)    Anion gap 4 (*)    All other components within normal limits  CBC - Abnormal; Notable for the following:    RBC 4.08 (*)    Hemoglobin 12.9 (*)    HCT 38.2 (*)    All other components within normal limits  TROPONIN I  TROPONIN I  TROPONIN I  I-STAT TROPOININ, ED  I-STAT TROPOININ, ED    EKG  EKG Interpretation  Date/Time:  Friday December 12 2015 14:16:20 EDT Ventricular Rate:  87 PR Interval:    QRS Duration: 82 QT Interval:  350 QTC Calculation: 421 R Axis:   60 Text Interpretation:  Sinus rhythm Minimal ST depression, inferior leads Interpretation limited secondary to artifact No acute changes Confirmed by Rhunette CroftNANAVATI, MD, Janey GentaANKIT [phone]([id number]) on 12/12/2015 2:24:03 PM       Radiology No results found.  Procedures Procedures (including critical care time)  Medications Ordered in ED Medications    nitroGLYCERIN (NITROGLYN) 2 % ointment 1 inch (1 inch Topical Given 12/12/15 1431)  aspirin EC tablet 81 mg (not administered)  nitroGLYCERIN (NITROSTAT) SL tablet 0.4 mg (not administered)  acetaminophen (TYLENOL) tablet 650 mg (not administered)  ondansetron (ZOFRAN) injection 4 mg (not administered)  enoxaparin (LOVENOX) injection 40 mg (not administered)  sodium chloride flush (NS) 0.9 % injection 3 mL (not administered)  sodium chloride flush (NS) 0.9 % injection 3 mL (not administered)  0.9 %  sodium chloride infusion (not administered)  ALPRAZolam (XANAX) tablet 0.25 mg (not administered)  zolpidem (AMBIEN) tablet 5 mg (not administered)  amLODipine (NORVASC) tablet 10 mg (not administered)  aspirin tablet 81 mg (not administered)  metoprolol tartrate (LOPRESSOR) tablet 75 mg (not administered)  MULTIVITAMIN LIQD (not administered)  simvastatin (ZOCOR) tablet 20 mg (not administered)  Initial Impression / Assessment and Plan / ED Course  I have reviewed the triage vital signs and the nursing notes.  Pertinent labs & imaging results that were available during my care of the patient were reviewed by me and considered in my medical decision making (see chart for details).  Clinical Course    BP 125/58   Pulse 75   Temp 98.6 F (37 C) (Oral)   Resp 16   SpO2 97%    Final Clinical Impressions(s) / ED Diagnoses   Final diagnoses:  Chest pain with moderate risk of acute coronary syndrome    New Prescriptions New Prescriptions   No medications on file    2:31 PM Patient here with pain concerning for unstable angina. EKG showed minimal ST depression in inferior leads which is not new. I have no suspicion for PE as patient is PERC negative.  No history of AAA, doubt dissection. Pain improves with aspirin and some nitroglycerin but has not fully resolved. Will apply Nitropaste and will consult cardiology for further care.  2:33 PM Appreciate consultation from  CardMaster Trish who will notify cardiologist to see pt in the ER for further management.  Care discussed with Dr. Rhunette CroftNanavati.    3:00 PM Patient report after receiving Nitropaste, his chest pain has completely resolved. He is resting comfortable at this time. Has a normal troponin.  5:22 PM Cardiology has seen pt in the ER and will admit for further evaluation and management.    Fayrene HelperBowie Giuseppina Quinones, PA-C 12/12/15 1722    Derwood KaplanAnkit Nanavati, MD 12/13/15 1422

## 2015-12-12 NOTE — ED Notes (Signed)
Meal order placed for pt.

## 2015-12-12 NOTE — ED Notes (Signed)
Pt to xray

## 2015-12-12 NOTE — ED Notes (Signed)
Pt returned from xray

## 2015-12-12 NOTE — H&P (Signed)
Patient ID: Alan Hernandez MRN: [id number], DOB/AGE: 1960/05/20   Admit date: 12/12/2015   Primary Physician: Pcp Not In System Primary Cardiologist: Dr SwazilandJordan  HPI: 55 year old male with history of MI status post DES to RCA in Sept 2012. He saw Dr SwazilandJordan in April 2013 and complained of some chest pain then. Dr SwazilandJordan notes the lesion was short and was stented with a 4.0 mm stent. He felt the risk of restenosis was quite low. The pt apparently was incarcerated shortly after this and has been in jail since. His medications are adjusted at the jail.  He came here today via EMS from the jail house for evaluation of chest pain. Patient reports for the past several weeks he has had intermittent chest discomfort lasting for several hours and resolved with rest. Today after eating lunch, he developed sharp pressure pain across his chest, became diaphoretic, lightheadedness, shortness of breath, felt nauseous. He received NTG with some relief and is almost pain free now. He feels his symptoms are similar to his pre PCI symptoms.    Problem List: Past Medical History:  Diagnosis Date  . Coronary artery disease    Status post inferior ST-elevation myocardial infarction treated with a drug-eluting stent to the right coronary artery this admission  . Dyslipidemia   . Left ventricular dysfunction Sept 2012   with an ejection fraction of 45-50%  . Renal insufficiency   . STEMI (ST elevation myocardial infarction) [facility]) Sept 2012   Inferior wall, s/p DES to RCA. EF 45 to 50%    Past Surgical History:  Procedure Laterality Date  . CARDIAC CATHETERIZATION  Sept 2012   Ejection fraction was 45-50% -- Single-vessel occlusive atherosclerotic coronary artery disease; Successful percutaneous intervention of the mid right coronary with a drug-eluting stent     Allergies: No Known Allergies   Home Medications Prior to Admission medications   Medication Sig Start Date End Date Taking? Authorizing  Provider  amLODipine (NORVASC) 10 MG tablet Take 10 mg by mouth daily.   Yes Historical Provider, MD  aspirin 81 MG tablet Take 81 mg by mouth daily.     Yes Historical Provider, MD  metoprolol (LOPRESSOR) 50 MG tablet Take 75 mg by mouth 2 (two) times daily.   Yes Historical Provider, MD  Multiple Vitamins-Minerals (MULTIVITAMIN PO) Take 1 tablet by mouth daily.   Yes Historical Provider, MD  nitroGLYCERIN (NITROSTAT) 0.4 MG SL tablet Place 0.4 mg under the tongue every 5 (five) minutes as needed for chest pain.    Yes Historical Provider, MD  simvastatin (ZOCOR) 20 MG tablet Take 20 mg by mouth daily.   Yes Historical Provider, MD  CRESTOR 40 MG tablet TAKE ONE TABLET BY MOUTH EVERY DAY AT BEDTIME Patient not taking: Reported on 12/12/2015 03/11/12   Peter M SwazilandJordan, MD  lisinopril (PRINIVIL,ZESTRIL) 5 MG tablet TAKE ONE TABLET BY MOUTH TWICE DAILY Patient not taking: Reported on 12/12/2015 07/04/12   Peter M [place]azilandJordan, MD  metoprolol tartrate (LOPRESSOR) 25 MG tablet TAKE ONE TABLET BY MOUTH TWICE DAILY Patient not taking: Reported on 12/12/2015 07/04/12   Peter M SwazilandJordan, MD  prasugrel (EFFIENT) 10 MG TABS Take 1 tablet (10 mg total) by mouth daily. Patient not taking: Reported on 12/12/2015 03/31/11   Peter M SwazilandJordan, MD     Family History  Problem Relation Age of Onset  . Heart disease      grandparents     Social History   Social History  .  Marital status: Married    Spouse name: N/A  . Number of children: N/A  . Years of education: N/A   Occupational History  . Not on file.   Social History Main Topics  . Smoking status: Never Smoker  . Smokeless tobacco: Not on file  . Alcohol use No  . Drug use: No  . Sexual activity: Not on file   Other Topics Concern  . 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  He has no history of alcohol, tobacco, or drug abuse.  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         FAMILY HISTORY:  Both his parents are alive in their late 14s and neither one has coronary artery disease.  No siblings have heart disease, but he has grandparents who had heart disease at a young age.      Review of Systems: General: negative for chills, fever, night sweats or weight changes.  Cardiovascular: negative for edema, orthopnea, palpitations HEENT: negative for any visual disturbances, blindness, glaucoma Dermatological: negative for rash Respiratory: negative for cough, hemoptysis, or wheezing Urologic: negative for hematuria or dysuria Abdominal: negative for nausea, vomiting, diarrhea, bright red blood per rectum, melena, or hematemesis Neurologic: negative for visual changes, syncope, or dizziness Musculoskeletal: negative for back pain, joint pain, or swelling Psych: cooperative and appropriate All other systems reviewed and are otherwise negative except as noted above.  Physical Exam: Blood pressure 101/78, pulse 82, temperature 98.6 F (37 C), temperature source Oral, resp. rate 14, SpO2 98 %.  General appearance: alert, cooperative and no distress Neck: no carotid bruit and no JVD Lungs: clear to auscultation bilaterally Heart: regular rate and rhythm Abdomen: soft, non-tender; bowel sounds normal; no masses,  no organomegaly Extremities: extremities normal, atraumatic, no cyanosis or edema Pulses: 2+ and symmetric Skin: Skin color, texture, turgor normal. No rashes or lesions Neurologic: Grossly normal    Labs:   Results for orders placed or performed during the hospital encounter of 12/12/15 (from the past 24 hour(s))  Basic metabolic panel     Status: Abnormal   Collection Time: 12/12/15  2:05 PM  Result Value Ref Range   Sodium 139 135 - 145 mmol/L   Potassium 4.1 3.5 - 5.1 mmol/L   Chloride 109 101 - 111 mmol/L   CO2 26 22 - 32 mmol/L   Glucose, Bld 113 (H) 65 - 99 mg/dL   BUN 6 6 - 20 mg/dL   Creatinine, Ser  6.96 0.61 - 1.24 mg/dL   Calcium 29.5 8.9 - 28.4 mg/dL   GFR calc non Af Amer >60 >60 mL/min   GFR calc Af Amer >60 >60 mL/min   Anion gap 4 (L) 5 - 15  CBC     Status: Abnormal   Collection Time: 12/12/15  2:05 PM  Result Value Ref Range   WBC 6.1 4.0 - 10.5 K/uL   RBC 4.08 (L) 4.22 - 5.81 MIL/uL   Hemoglobin 12.9 (L) 13.0 - 17.0 g/dL   HCT 13.2 (L) 44.0 - 10.2 %   MCV 93.6 78.0 - 100.0 fL   MCH 31.6 26.0 - 34.0 pg   MCHC 33.8 30.0 - 36.0 g/dL   RDW 72.5 36.6 - 44.0 %   Platelets 260 150 - 400 K/uL  I-stat troponin, ED     Status: None   Collection Time: 12/12/15  2:27  PM  Result Value Ref Range   Troponin i, poc 0.01 0.00 - 0.08 ng/mL   Comment 3             Radiology/Studies: Dg Chest 2 View  Result Date: 12/12/2015 CLINICAL DATA:  Shooting pain in center of abdomen going to the RIGHT, history MI and prior coronary stenting, queasiness, diaphoresis EXAM: CHEST  2 VIEW COMPARISON:  12/31/2010 FINDINGS: Normal heart size, mediastinal contours, and pulmonary vascularity. Lungs clear. No pleural effusion or pneumothorax. Bones unremarkable. IMPRESSION: Normal exam. Electronically Signed   By: Ulyses SouthwardMark  Boles M.D.   On: 12/12/2015 15:09    EKG:NSR  ASSESSMENT AND PLAN:  Principal Problem:   Chest pain with moderate risk of acute coronary syndrome Active Problems:   Left ventricular dysfunction   Dyslipidemia   Incarceration   PLAN: Admit, cycle Troponin, Myoview in am if they remain negative, cath Monday if positive.    Jolene ProvostSigned, Kash Mothershead, PA-C 12/12/2015, 5:08 PM [phone]

## 2015-12-13 ENCOUNTER — Observation Stay (HOSPITAL_COMMUNITY)

## 2015-12-13 DIAGNOSIS — I25700 Atherosclerosis of coronary artery bypass graft(s), unspecified, with unstable angina pectoris: Secondary | ICD-10-CM | POA: Diagnosis not present

## 2015-12-13 DIAGNOSIS — Z85820 Personal history of malignant melanoma of skin: Secondary | ICD-10-CM | POA: Diagnosis not present

## 2015-12-13 DIAGNOSIS — E785 Hyperlipidemia, unspecified: Secondary | ICD-10-CM | POA: Diagnosis not present

## 2015-12-13 DIAGNOSIS — I252 Old myocardial infarction: Secondary | ICD-10-CM | POA: Diagnosis not present

## 2015-12-13 DIAGNOSIS — Z7982 Long term (current) use of aspirin: Secondary | ICD-10-CM | POA: Diagnosis not present

## 2015-12-13 DIAGNOSIS — Z955 Presence of coronary angioplasty implant and graft: Secondary | ICD-10-CM | POA: Diagnosis not present

## 2015-12-13 DIAGNOSIS — I519 Heart disease, unspecified: Secondary | ICD-10-CM

## 2015-12-13 DIAGNOSIS — I25110 Atherosclerotic heart disease of native coronary artery with unstable angina pectoris: Secondary | ICD-10-CM | POA: Diagnosis present

## 2015-12-13 DIAGNOSIS — I25119 Atherosclerotic heart disease of native coronary artery with unspecified angina pectoris: Secondary | ICD-10-CM

## 2015-12-13 DIAGNOSIS — R079 Chest pain, unspecified: Secondary | ICD-10-CM | POA: Diagnosis present

## 2015-12-13 DIAGNOSIS — L989 Disorder of the skin and subcutaneous tissue, unspecified: Secondary | ICD-10-CM | POA: Diagnosis present

## 2015-12-13 DIAGNOSIS — I200 Unstable angina: Secondary | ICD-10-CM | POA: Diagnosis not present

## 2015-12-13 LAB — NM MYOCAR MULTI W/SPECT W/WALL MOTION / EF
Estimated workload: 1 METS
MPHR: 164 {beats}/min
Peak HR: 113 {beats}/min
Percent HR: 68 %
Rest HR: 70 {beats}/min

## 2015-12-13 LAB — MRSA PCR SCREENING: MRSA by PCR: NEGATIVE

## 2015-12-13 LAB — TROPONIN I
Troponin I: <0.03 ng/mL (ref <0.03)
Troponin I: <0.03 ng/mL (ref <0.03)

## 2015-12-13 MED ORDER — REGADENOSON 0.4 MG/5ML IV SOLN
INTRAVENOUS | Status: AC
  Filled 2015-12-13: qty 5

## 2015-12-13 MED ORDER — TECHNETIUM TC 99M TETROFOSMIN IV KIT
30.0000 | PACK | Freq: Once | INTRAVENOUS | Status: AC | PRN
  Administered 2015-12-13: 30 via INTRAVENOUS

## 2015-12-13 MED ORDER — REGADENOSON 0.4 MG/5ML IV SOLN
0.4000 mg | Freq: Once | INTRAVENOUS | Status: AC
Start: 2015-12-13 — End: 2015-12-13
  Administered 2015-12-13: 0.4 mg via INTRAVENOUS
  Filled 2015-12-13: qty 5

## 2015-12-13 MED ORDER — TECHNETIUM TC 99M TETROFOSMIN IV KIT
10.0000 | PACK | Freq: Once | INTRAVENOUS | Status: AC | PRN
  Administered 2015-12-13: 10 via INTRAVENOUS

## 2015-12-13 NOTE — Progress Notes (Signed)
    Primary cardiologist: Dr. Peter SwazilandJordan  Seen for followup: Chest pain, CAD  Subjective:    No chest pain at present, comfortable, no breathlessness or palpitations.  Objective:   Temp:  [97.9 F (36.6 C)-98.6 F (37 C)] 98 F (36.7 C) (08/19 0406) Pulse Rate:  [73-94] 77 (08/19 0406) Resp:  [14-19] 18 (08/19 0406) BP: (95-135)/(55-89) 135/71 (08/19 0406) SpO2:  [94 %-99 %] 99 % (08/19 0406) Last BM Date: 12/12/15  There were no vitals filed for this visit. No intake or output data in the 24 hours ending 12/13/15 1016  Telemetry: Sinus rhythm.  Exam:  General: Patient comfortable at rest, shackled to bed with law enforcement at bedside x 2.  Lungs: Clear, nonlabored.  Cardiac: RRR without gallop.  Abdomen: NABS.  Extremities: No pitting edema.  Lab Results:  Basic Metabolic Panel:  Recent Labs Lab 12/12/15 1405  NA 139  K 4.1  CL 109  CO2 26  GLUCOSE 113*  BUN 6  CREATININE 1.01  CALCIUM 10.1    CBC:  Recent Labs Lab 12/12/15 1405  WBC 6.1  HGB 12.9*  HCT 38.2*  MCV 93.6  PLT 260    Cardiac Enzymes:  Recent Labs Lab 12/12/15 1845 12/13/15 0000 12/13/15 0452  TROPONINI <0.03 <0.03 <0.03    ECG: Tracing from 12/12/2015 showed sinus rhythm with nonspecific ST changes.  Chest x-ray 12/12/2015: FINDINGS: Normal heart size, mediastinal contours, and pulmonary vascularity.  Lungs clear.  No pleural effusion or pneumothorax.  Bones unremarkable.  IMPRESSION: Normal exam.  Medications:   Scheduled Medications: . amLODipine  10 mg Oral Daily  . aspirin EC  81 mg Oral Daily  . aspirin EC  81 mg Oral Daily  . enoxaparin (LOVENOX) injection  40 mg Subcutaneous Q24H  . metoprolol  75 mg Oral BID  . multivitamin   Oral Daily  . nitroGLYCERIN  1 inch Topical Q6H  . simvastatin  20 mg Oral Daily  . sodium chloride flush  3 mL Intravenous Q12H      PRN Medications:  sodium chloride, acetaminophen, ALPRAZolam, nitroGLYCERIN,  ondansetron (ZOFRAN) IV, sodium chloride flush, zolpidem   Assessment:   1. Precordial pain, ECG without significant ST segment changes and troponin I levels negative.  2. History of CAD status post DES to the RCA in the setting of myocardial infarction back in September 2012. LVEF was 45-50% in 2012.  3. Active incarceration since 2013. Law enforcement at bedside.  4. Hyperlipidemia, on Zocor.   Plan/Discussion:    History and physical reviewed, patient scheduled for Myoview which is pending. If results are low risk anticipate discharge, otherwise if significant ischemic regions are identified will likely need cardiac catheterization for further evaluation.   Jonelle SidleSamuel G. Damira Kem, M.D., F.A.C.C.

## 2015-12-13 NOTE — Progress Notes (Signed)
Reviewed findings of nuclear stress test.  Moderate severity moderate-sized defect involving the apex and inferior septal wall, suspicious for inducible ischemia, Septal wall hypokinesis, mild.Left ventricular ejection fraction 51%.  Patient's symptoms on presentation worrisome for USAP and now with abnormal stress test, will need to stay over weekend for cardiac cath on Monday.

## 2015-12-14 ENCOUNTER — Encounter (HOSPITAL_COMMUNITY): Payer: Self-pay

## 2015-12-14 ENCOUNTER — Inpatient Hospital Stay (HOSPITAL_COMMUNITY)

## 2015-12-14 DIAGNOSIS — I200 Unstable angina: Secondary | ICD-10-CM

## 2015-12-14 DIAGNOSIS — R079 Chest pain, unspecified: Secondary | ICD-10-CM

## 2015-12-14 LAB — ECHOCARDIOGRAM COMPLETE

## 2015-12-14 MED ORDER — ASPIRIN 81 MG PO CHEW
81.0000 mg | CHEWABLE_TABLET | ORAL | Status: AC
  Administered 2015-12-15: 81 mg via ORAL
  Filled 2015-12-14: qty 1

## 2015-12-14 MED ORDER — ASPIRIN 81 MG PO CHEW: 81.0000 mg | CHEWABLE_TABLET | ORAL | Status: DC

## 2015-12-14 MED ORDER — SODIUM CHLORIDE 0.9% FLUSH
3.0000 mL | Freq: Two times a day (BID) | INTRAVENOUS | Status: DC
  Administered 2015-12-14: 3 mL via INTRAVENOUS

## 2015-12-14 MED ORDER — SODIUM CHLORIDE 0.9 % WEIGHT BASED INFUSION
3.0000 mL/kg/h | INTRAVENOUS | Status: DC
  Administered 2015-12-15: 3 mL/kg/h via INTRAVENOUS

## 2015-12-14 MED ORDER — SODIUM CHLORIDE 0.9% FLUSH: 3.0000 mL | Freq: Two times a day (BID) | INTRAVENOUS | Status: DC

## 2015-12-14 MED ORDER — SODIUM CHLORIDE 0.9 % WEIGHT BASED INFUSION: 1.0000 mL/kg/h | INTRAVENOUS | Status: DC

## 2015-12-14 MED ORDER — ADULT MULTIVITAMIN W/MINERALS CH
1.0000 | ORAL_TABLET | Freq: Every day | ORAL | Status: DC
  Administered 2015-12-14 – 2015-12-16 (×3): 1 via ORAL
  Filled 2015-12-14 (×3): qty 1

## 2015-12-14 MED ORDER — AMIODARONE IV BOLUS ONLY 150 MG/100ML
150.0000 mg | Freq: Once | INTRAVENOUS | Status: DC
  Filled 2015-12-14: qty 100

## 2015-12-14 MED ORDER — SODIUM CHLORIDE 0.9% FLUSH: 3.0000 mL | INTRAVENOUS | Status: DC | PRN

## 2015-12-14 MED ORDER — SODIUM CHLORIDE 0.9 % IV SOLN: 250.0000 mL | INTRAVENOUS | Status: DC | PRN

## 2015-12-14 MED ORDER — SODIUM CHLORIDE 0.9 % WEIGHT BASED INFUSION: 3.0000 mL/kg/h | INTRAVENOUS | Status: DC

## 2015-12-14 MED ORDER — METOPROLOL TARTRATE 5 MG/5ML IV SOLN: 2.5000 mg | Freq: Once | INTRAVENOUS | Status: DC

## 2015-12-14 NOTE — Progress Notes (Signed)
Pt noted to have open wound on lower back with some clear discharge.  Per Pt, he was diagnosed with melanoma 12 weeks ago.  Alerted cardiology PA.  Will cont to monitor.

## 2015-12-14 NOTE — Progress Notes (Signed)
Echocardiogram 2D Echocardiogram has been performed.  Marisue Humblelexis N Elfrida Pixley 12/14/2015, 11:22 AM

## 2015-12-14 NOTE — Progress Notes (Signed)
Primary cardiologist: Dr. Peter SwazilandJordan  Seen for followup: Chest pain, CAD  Subjective:    States he had chest pain overnight. Currently pain-free and breathing comfortably.  Objective:   Temp:  [97.5 F (36.4 C)-97.7 F (36.5 C)] 97.5 F (36.4 C) (08/20 0620) Pulse Rate:  [79-97] 85 (08/20 0620) Resp:  [18] 18 (08/20 0620) BP: (112-154)/(67-87) 136/76 (08/20 0620) SpO2:  [97 %] 97 % (08/20 0620) Last BM Date: 12/12/15  There were no vitals filed for this visit.  Intake/Output Summary (Last 24 hours) at 12/14/15 1144 Last data filed at 12/14/15 0700  Gross per 24 hour  Intake              585 ml  Output                2 ml  Net              583 ml    Telemetry: Sinus rhythm.  Exam:  General: Patient comfortable at rest, shackled to bed with law enforcement at bedside x 2.  Lungs: Clear, nonlabored.  Cardiac: RRR without gallop.  Abdomen: NABS.  Extremities: No pitting edema.  Lab Results:  Basic Metabolic Panel:  Recent Labs Lab 12/12/15 1405  NA 139  K 4.1  CL 109  CO2 26  GLUCOSE 113*  BUN 6  CREATININE 1.01  CALCIUM 10.1    CBC:  Recent Labs Lab 12/12/15 1405  WBC 6.1  HGB 12.9*  HCT 38.2*  MCV 93.6  PLT 260    Cardiac Enzymes:  Recent Labs Lab 12/12/15 1845 12/13/15 0000 12/13/15 0452  TROPONINI <0.03 <0.03 <0.03    ECG: Tracing from 12/12/2015 showed sinus rhythm with nonspecific ST changes.  Chest x-ray 12/12/2015: FINDINGS: Normal heart size, mediastinal contours, and pulmonary vascularity.  Lungs clear.  No pleural effusion or pneumothorax.  Bones unremarkable.  IMPRESSION: Normal exam.  Lexiscan Myoview 12/13/2015: IMPRESSION: 1. Moderate severity moderate-sized defect involving the apex and inferior septal wall, suspicious for inducible ischemia.  2. Septal wall hypokinesis, mild.  3. Left ventricular ejection fraction 51%  4. Non invasive risk stratification*: Intermediate.  Medications:     Scheduled Medications: . amLODipine  10 mg Oral Daily  . aspirin EC  81 mg Oral Daily  . enoxaparin (LOVENOX) injection  40 mg Subcutaneous Q24H  . metoprolol  75 mg Oral BID  . multivitamin with minerals  1 tablet Oral Daily  . nitroGLYCERIN  1 inch Topical Q6H  . simvastatin  20 mg Oral Daily  . sodium chloride flush  3 mL Intravenous Q12H     PRN Medications: sodium chloride, acetaminophen, ALPRAZolam, nitroGLYCERIN, ondansetron (ZOFRAN) IV, sodium chloride flush, zolpidem   Assessment:   1. Precordial pain, ECG without significant ST segment changes and troponin I levels negative. Myoview from yesterday suggest inferoseptal/apical ischemia of moderate degree with LVEF 51%. Reviewed by Dr. Mayford Knifeurner with recommendation for cardiac catheterization.  2. History of CAD status post DES to the RCA in the setting of myocardial infarction back in September 2012. LVEF was 45-50% in 2012.  3. Active incarceration since 2013. Law enforcement at bedside.  4. Hyperlipidemia, on Zocor.  5. Recent resection of skin lesion on the left lower back, reportedly diagnosed with melanoma and in need of a wider excision.   Plan/Discussion:    Patient scheduled for cardiac catheterization tomorrow for evaluation of coronary anatomy and determine if any revascularization options need to be considered. Recent diagnosis of skin lesion  as melanoma with need for wide resection should be taken in to account with choice of stent (if needed) as DAPT would have to be interrupted for a wider skin excision and it is likely that this should not be put off for several months.   Alan Hernandez, M.D., F.A.C.C.

## 2015-12-15 ENCOUNTER — Encounter (HOSPITAL_COMMUNITY): Admission: EM | Payer: Self-pay | Source: Home / Self Care | Attending: Cardiology

## 2015-12-15 ENCOUNTER — Encounter (HOSPITAL_COMMUNITY): Payer: Self-pay | Admitting: General Practice

## 2015-12-15 HISTORY — PX: CARDIAC CATHETERIZATION: SHX172

## 2015-12-15 LAB — BASIC METABOLIC PANEL
Anion gap: 5 (ref 5–15)
BUN: 15 mg/dL (ref 6–20)
CALCIUM: 9.8 mg/dL (ref 8.9–10.3)
CO2: 26 mmol/L (ref 22–32)
Chloride: 107 mmol/L (ref 101–111)
Creatinine, Ser: 0.97 mg/dL (ref 0.61–1.24)
GFR calc Af Amer: >60 mL/min (ref >60)
GLUCOSE: 98 mg/dL (ref 65–99)
Potassium: 3.8 mmol/L (ref 3.5–5.1)
SODIUM: 138 mmol/L (ref 135–145)

## 2015-12-15 LAB — PROTIME-INR
INR: 0.98
PROTHROMBIN TIME: 13 s (ref 11.4–15.2)

## 2015-12-15 LAB — CBC
HCT: 39.2 % (ref 39.0–52.0)
Hemoglobin: 13.2 g/dL (ref 13.0–17.0)
MCH: 31.7 pg (ref 26.0–34.0)
MCHC: 33.7 g/dL (ref 30.0–36.0)
MCV: 94 fL (ref 78.0–100.0)
PLATELETS: 232 10*3/uL (ref 150–400)
RBC: 4.17 MIL/uL — ABNORMAL LOW (ref 4.22–5.81)
RDW: 13 % (ref 11.5–15.5)
WBC: 7.6 10*3/uL (ref 4.0–10.5)

## 2015-12-15 SURGERY — LEFT HEART CATH AND CORONARY ANGIOGRAPHY
Anesthesia: LOCAL

## 2015-12-15 MED ORDER — HEPARIN SODIUM (PORCINE) 1000 UNIT/ML IJ SOLN
INTRAMUSCULAR | Status: DC | PRN
  Administered 2015-12-15: 4000 [IU] via INTRAVENOUS

## 2015-12-15 MED ORDER — MIDAZOLAM HCL 2 MG/2ML IJ SOLN
INTRAMUSCULAR | Status: AC
  Filled 2015-12-15: qty 2

## 2015-12-15 MED ORDER — SODIUM CHLORIDE 0.9% FLUSH: 3.0000 mL | INTRAVENOUS | Status: DC | PRN

## 2015-12-15 MED ORDER — ISOSORBIDE MONONITRATE ER 30 MG PO TB24
30.0000 mg | ORAL_TABLET | Freq: Every day | ORAL | Status: DC
  Administered 2015-12-15 – 2015-12-16 (×2): 30 mg via ORAL
  Filled 2015-12-15 (×2): qty 1

## 2015-12-15 MED ORDER — FENTANYL CITRATE (PF) 100 MCG/2ML IJ SOLN
INTRAMUSCULAR | Status: AC
  Filled 2015-12-15: qty 2

## 2015-12-15 MED ORDER — HEPARIN (PORCINE) IN NACL 2-0.9 UNIT/ML-% IJ SOLN
INTRAMUSCULAR | Status: DC | PRN
  Administered 2015-12-15: 10 mL via INTRA_ARTERIAL

## 2015-12-15 MED ORDER — SODIUM CHLORIDE 0.9% FLUSH
3.0000 mL | Freq: Two times a day (BID) | INTRAVENOUS | Status: DC
  Administered 2015-12-16: 3 mL via INTRAVENOUS

## 2015-12-15 MED ORDER — LIDOCAINE HCL (PF) 1 % IJ SOLN
INTRAMUSCULAR | Status: DC | PRN
  Administered 2015-12-15: 1 mL

## 2015-12-15 MED ORDER — IOPAMIDOL (ISOVUE-370) INJECTION 76%
INTRAVENOUS | Status: AC
Start: 2015-12-15 — End: 2015-12-15
  Filled 2015-12-15: qty 100

## 2015-12-15 MED ORDER — MIDAZOLAM HCL 2 MG/2ML IJ SOLN
INTRAMUSCULAR | Status: DC | PRN
  Administered 2015-12-15: 1 mg via INTRAVENOUS

## 2015-12-15 MED ORDER — HEPARIN (PORCINE) IN NACL 2-0.9 UNIT/ML-% IJ SOLN
INTRAMUSCULAR | Status: AC
  Filled 2015-12-15: qty 1000

## 2015-12-15 MED ORDER — VERAPAMIL HCL 2.5 MG/ML IV SOLN
INTRAVENOUS | Status: AC
Start: 2015-12-15 — End: 2015-12-15
  Filled 2015-12-15: qty 2

## 2015-12-15 MED ORDER — SODIUM CHLORIDE 0.9 % IV SOLN
INTRAVENOUS | Status: AC
  Administered 2015-12-15: 15:00:00 via INTRAVENOUS

## 2015-12-15 MED ORDER — IOPAMIDOL (ISOVUE-370) INJECTION 76%
INTRAVENOUS | Status: DC | PRN
  Administered 2015-12-15: 45 mL via INTRA_ARTERIAL

## 2015-12-15 MED ORDER — HEPARIN (PORCINE) IN NACL 2-0.9 UNIT/ML-% IJ SOLN
INTRAMUSCULAR | Status: DC | PRN
  Administered 2015-12-15: 1500 mL

## 2015-12-15 MED ORDER — LIDOCAINE HCL (PF) 1 % IJ SOLN
INTRAMUSCULAR | Status: AC
  Filled 2015-12-15: qty 30

## 2015-12-15 MED ORDER — SODIUM CHLORIDE 0.9 % IV SOLN: 250.0000 mL | INTRAVENOUS | Status: DC | PRN

## 2015-12-15 MED ORDER — HEPARIN SODIUM (PORCINE) 1000 UNIT/ML IJ SOLN
INTRAMUSCULAR | Status: AC
  Filled 2015-12-15: qty 1

## 2015-12-15 MED ORDER — ROSUVASTATIN CALCIUM 10 MG PO TABS
40.0000 mg | ORAL_TABLET | Freq: Every day | ORAL | Status: DC
  Administered 2015-12-15: 40 mg via ORAL
  Filled 2015-12-15: qty 4

## 2015-12-15 MED ORDER — FENTANYL CITRATE (PF) 100 MCG/2ML IJ SOLN
INTRAMUSCULAR | Status: DC | PRN
  Administered 2015-12-15: 50 ug via INTRAVENOUS

## 2015-12-15 SURGICAL SUPPLY — 9 items
CATH IMPULSE 5F ANG/FL3.5 (CATHETERS) ×1 IMPLANT
DEVICE RAD COMP TR BAND LRG (VASCULAR PRODUCTS) ×1 IMPLANT
GLIDESHEATH SLEND SS 6F .021 (SHEATH) ×1 IMPLANT
KIT HEART LEFT (KITS) ×2 IMPLANT
PACK CARDIAC CATHETERIZATION (CUSTOM PROCEDURE TRAY) ×2 IMPLANT
SYR MEDRAD MARK V 150ML (SYRINGE) ×1 IMPLANT
TRANSDUCER W/STOPCOCK (MISCELLANEOUS) ×2 IMPLANT
TUBING CIL FLEX 10 FLL-RA (TUBING) ×2 IMPLANT
WIRE SAFE-T 1.5MM-J .035X260CM (WIRE) ×1 IMPLANT

## 2015-12-15 NOTE — Progress Notes (Signed)
Pt received from cath lab. Sheriff x3 at bedside. Site is level 0, pt educated on not using right arm. Pt verbalizes understanding. Call light within reach, VSS, will continue to monitor.   Leonidas Rombergaitlin S Bumbledare, RN

## 2015-12-15 NOTE — Interval H&P Note (Signed)
Cath Lab Visit (complete for each Cath Lab visit)  Clinical Evaluation Leading to the Procedure:   ACS: Yes.    Non-ACS:    Anginal Classification: CCS III  Anti-ischemic medical therapy: Maximal Therapy (2 or more classes of medications)  Non-Invasive Test Results: Intermediate-risk stress test findings: cardiac mortality 1-3%/year  Prior CABG: No previous CABG      History and Physical Interval Note:  12/15/2015 2:10 PM  Alan Hernandez  has presented today for surgery, with the diagnosis of unstable angina  The various methods of treatment have been discussed with the patient and family. After consideration of risks, benefits and other options for treatment, the patient has consented to  Procedure(s): Left Heart Cath and Coronary Angiography (N/A) as a surgical intervention .  The patient's history has been reviewed, patient examined, no change in status, stable for surgery.  I have reviewed the patient's chart and labs.  Questions were answered to the patient's satisfaction.     Lyn RecordsHenry W Taryne Kiger III

## 2015-12-15 NOTE — H&P (View-Only) (Signed)
Primary cardiologist: Dr. Peter SwazilandJordan  Seen for followup: Chest pain, CAD  Subjective:    No chest pain at present, comfortable, no breathlessness or palpitations. Had CP over the weekend The pain is similar to his previous episodes of angina    Objective:   Temp:  [97.8 F (36.6 C)-98.2 F (36.8 C)] 97.8 F (36.6 C) (08/21 0607) Pulse Rate:  [67-75] 70 (08/21 0607) Resp:  [18] 18 (08/21 0607) BP: (128-142)/(68-78) 142/68 (08/21 0607) SpO2:  [97 %-98 %] 98 % (08/21 0607) Weight:  [184 lb 11.2 oz (83.8 kg)-185 lb 6.4 oz (84.1 kg)] 184 lb 11.2 oz (83.8 kg) (08/21 0607) Last BM Date: 12/14/15  Filed Weights   12/14/15 1254 12/15/15 0607  Weight: 185 lb 6.4 oz (84.1 kg) 184 lb 11.2 oz (83.8 kg)    Intake/Output Summary (Last 24 hours) at 12/15/15 0823 Last data filed at 12/14/15 1230  Gross per 24 hour  Intake              200 ml  Output                4 ml  Net              196 ml    Telemetry: Sinus rhythm.  Exam:  General: Patient comfortable at rest, shackled to bed with law enforcement at bedside x 2.  Lungs: Clear, nonlabored.  Cardiac: RRR without gallop.  Abdomen: NABS.  Extremities: No pitting edema.  Lab Results:  Basic Metabolic Panel:  Recent Labs Lab 12/12/15 1405 12/15/15 0511  NA 139 138  K 4.1 3.8  CL 109 107  CO2 26 26  GLUCOSE 113* 98  BUN 6 15  CREATININE 1.01 0.97  CALCIUM 10.1 9.8    CBC:  Recent Labs Lab 12/12/15 1405 12/15/15 0511  WBC 6.1 7.6  HGB 12.9* 13.2  HCT 38.2* 39.2  MCV 93.6 94.0  PLT 260 232    Cardiac Enzymes:  Recent Labs Lab 12/12/15 1845 12/13/15 0000 12/13/15 0452  TROPONINI <0.03 <0.03 <0.03    ECG: Tracing from 12/12/2015 showed sinus rhythm with nonspecific ST changes.  Chest x-ray 12/12/2015: FINDINGS: Normal heart size, mediastinal contours, and pulmonary vascularity.  Lungs clear.  No pleural effusion or pneumothorax.  Bones unremarkable.  IMPRESSION: Normal  exam.  Medications:   Scheduled Medications: . amLODipine  10 mg Oral Daily  . aspirin EC  81 mg Oral Daily  . enoxaparin (LOVENOX) injection  40 mg Subcutaneous Q24H  . metoprolol  75 mg Oral BID  . multivitamin with minerals  1 tablet Oral Daily  . nitroGLYCERIN  1 inch Topical Q6H  . simvastatin  20 mg Oral Daily  . sodium chloride flush  3 mL Intravenous Q12H  . sodium chloride flush  3 mL Intravenous Q12H  . sodium chloride flush  3 mL Intravenous Q12H     PRN Medications: sodium chloride, sodium chloride, sodium chloride, acetaminophen, ALPRAZolam, nitroGLYCERIN, ondansetron (ZOFRAN) IV, sodium chloride flush, sodium chloride flush, sodium chloride flush, zolpidem   Assessment / Plan    1. Precordial pain, ECG without significant ST segment changes and troponin I levels negative. myoview :    Moderate severity moderate-sized defect involving the apex and inferior septal wall, suspicious for inducible ischemia. For cath today  Pain was similar to his previous CP   2. History of CAD status post DES to the RCA in the setting of myocardial infarction back in September 2012. LVEF was  45-50% in 2012.  3. Active incarceration since 2013. Law enforcement at bedside.  4. Hyperlipidemia, on Zocor.     Kristeen MissPhilip Jeneen Doutt, MD  12/15/2015 8:30 AM    [facility] [address],  [address] [place], [place]  [address] Pager [phone] Phone: [phone]; Fax: [phone]

## 2015-12-15 NOTE — Progress Notes (Signed)
Primary cardiologist: Dr. Peter SwazilandJordan  Seen for followup: Chest pain, CAD  Subjective:    No chest pain at present, comfortable, no breathlessness or palpitations. Had CP over the weekend The pain is similar to his previous episodes of angina    Objective:   Temp:  [97.8 F (36.6 C)-98.2 F (36.8 C)] 97.8 F (36.6 C) (08/21 0607) Pulse Rate:  [67-75] 70 (08/21 0607) Resp:  [18] 18 (08/21 0607) BP: (128-142)/(68-78) 142/68 (08/21 0607) SpO2:  [97 %-98 %] 98 % (08/21 0607) Weight:  [184 lb 11.2 oz (83.8 kg)-185 lb 6.4 oz (84.1 kg)] 184 lb 11.2 oz (83.8 kg) (08/21 0607) Last BM Date: 12/14/15  Filed Weights   12/14/15 1254 12/15/15 0607  Weight: 185 lb 6.4 oz (84.1 kg) 184 lb 11.2 oz (83.8 kg)    Intake/Output Summary (Last 24 hours) at 12/15/15 0823 Last data filed at 12/14/15 1230  Gross per 24 hour  Intake              200 ml  Output                4 ml  Net              196 ml    Telemetry: Sinus rhythm.  Exam:  General: Patient comfortable at rest, shackled to bed with law enforcement at bedside x 2.  Lungs: Clear, nonlabored.  Cardiac: RRR without gallop.  Abdomen: NABS.  Extremities: No pitting edema.  Lab Results:  Basic Metabolic Panel:  Recent Labs Lab 12/12/15 1405 12/15/15 0511  NA 139 138  K 4.1 3.8  CL 109 107  CO2 26 26  GLUCOSE 113* 98  BUN 6 15  CREATININE 1.01 0.97  CALCIUM 10.1 9.8    CBC:  Recent Labs Lab 12/12/15 1405 12/15/15 0511  WBC 6.1 7.6  HGB 12.9* 13.2  HCT 38.2* 39.2  MCV 93.6 94.0  PLT 260 232    Cardiac Enzymes:  Recent Labs Lab 12/12/15 1845 12/13/15 0000 12/13/15 0452  TROPONINI <0.03 <0.03 <0.03    ECG: Tracing from 12/12/2015 showed sinus rhythm with nonspecific ST changes.  Chest x-ray 12/12/2015: FINDINGS: Normal heart size, mediastinal contours, and pulmonary vascularity.  Lungs clear.  No pleural effusion or pneumothorax.  Bones unremarkable.  IMPRESSION: Normal  exam.  Medications:   Scheduled Medications: . amLODipine  10 mg Oral Daily  . aspirin EC  81 mg Oral Daily  . enoxaparin (LOVENOX) injection  40 mg Subcutaneous Q24H  . metoprolol  75 mg Oral BID  . multivitamin with minerals  1 tablet Oral Daily  . nitroGLYCERIN  1 inch Topical Q6H  . simvastatin  20 mg Oral Daily  . sodium chloride flush  3 mL Intravenous Q12H  . sodium chloride flush  3 mL Intravenous Q12H  . sodium chloride flush  3 mL Intravenous Q12H     PRN Medications: sodium chloride, sodium chloride, sodium chloride, acetaminophen, ALPRAZolam, nitroGLYCERIN, ondansetron (ZOFRAN) IV, sodium chloride flush, sodium chloride flush, sodium chloride flush, zolpidem   Assessment / Plan    1. Precordial pain, ECG without significant ST segment changes and troponin I levels negative. myoview :    Moderate severity moderate-sized defect involving the apex and inferior septal wall, suspicious for inducible ischemia. For cath today  Pain was similar to his previous CP   2. History of CAD status post DES to the RCA in the setting of myocardial infarction back in September 2012. LVEF was  45-50% in 2012.  3. Active incarceration since 2013. Law enforcement at bedside.  4. Hyperlipidemia, on Zocor.     Dareen Gutzwiller, MD  12/15/2015 8:30 AM    [facility] [address],  [address] [place], [place]  [address] Pager [phone] Phone: [phone]; Fax: [phone]      

## 2015-12-16 MED ORDER — ROSUVASTATIN CALCIUM 40 MG PO TABS: 40.0000 mg | ORAL_TABLET | Freq: Every day | ORAL | 10 refills | Status: AC

## 2015-12-16 MED ORDER — ISOSORBIDE MONONITRATE ER 30 MG PO TB24: 30.0000 mg | ORAL_TABLET | Freq: Every day | ORAL | 11 refills | Status: AC

## 2015-12-16 MED ORDER — NITROGLYCERIN 0.4 MG SL SUBL
0.4000 mg | SUBLINGUAL_TABLET | SUBLINGUAL | 2 refills | Status: AC | PRN
Start: 2015-12-16 — End: ?

## 2015-12-16 NOTE — Progress Notes (Signed)
Patient Profile: 55 y/o male with a history of ASCAD with remote MI and PCI with DES to RCA in 2012.  He has been incarcerated since 2013. He was admitted 12/12/15 with chest pain. Ruled out for MI. NST abnormal with moderate severity, moderate sized defect involving the apex and inferior septal wall apical to mid. LHC with non critical CAD, to be treated medically.     Subjective: Law enforcement by bedside. No complaints. Currently CP free. No dyspnea. Radial cath site stable.   Objective: Vital signs in last 24 hours: Temp:  [97.5 F (36.4 C)-98 F (36.7 C)] 97.5 F (36.4 C) (08/22 0609) Pulse Rate:  [0-82] 66 (08/22 0916) Resp:  [0-30] 18 (08/22 0609) BP: (104-168)/(50-89) 128/66 (08/22 0916) SpO2:  [0 %-100 %] 98 % (08/22 0609) Last BM Date: 12/14/15  Intake/Output from previous day: 08/21 0701 - 08/22 0700 In: 500 [P.O.:500] Out: 3 [Urine:3] Intake/Output this shift: No intake/output data recorded.  Medications Current Facility-Administered Medications  Medication Dose Route Frequency Provider Last Rate Last Dose  . 0.9 %  sodium chloride infusion  250 mL Intravenous PRN Luke K Kilroy, PA-C      . 0.9 %  sodium chloride infusion  250 mL Intravenous PRN Yvonne Kendallhristopher End, MD      . acetaminophen (TYLENOL) tablet 650 mg  650 mg Oral Q4H PRN Abelino DerrickLuke K Kilroy, PA-C      . ALPRAZolam Prudy Feeler(XANAX) tablet 0.25 mg  0.25 mg Oral BID PRN Abelino DerrickLuke K Kilroy, PA-C      . amLODipine (NORVASC) tablet 10 mg  10 mg Oral Daily Abelino DerrickLuke K Kilroy, PA-C   10 mg at 12/16/15 0916  . aspirin EC tablet 81 mg  81 mg Oral Daily Abelino DerrickLuke K Kilroy, PA-C   81 mg at 12/16/15 [id number]  . enoxaparin (LOVENOX) injection 40 mg  40 mg Subcutaneous Q24H Eda PaschalLuke K Kilroy, PA-C   40 mg at 12/14/15 1739  . isosorbide mononitrate (IMDUR) 24 hr tablet 30 mg  30 mg Oral Daily Yvonne Kendallhristopher End, MD   30 mg at 12/16/15 0916  . metoprolol tartrate (LOPRESSOR) tablet 75 mg  75 mg Oral BID Abelino DerrickLuke K Kilroy, PA-C   75 mg at 12/16/15 [id number]  .  multivitamin with minerals tablet 1 tablet  1 tablet Oral Daily Peter M SwazilandJordan, MD   1 tablet at 12/16/15 0916  . nitroGLYCERIN (NITROSTAT) SL tablet 0.4 mg  0.4 mg Sublingual Q5 Min x 3 PRN Abelino DerrickLuke K Kilroy, PA-C   0.4 mg at 12/13/15 1549  . ondansetron (ZOFRAN) injection 4 mg  4 mg Intravenous Q6H PRN Abelino DerrickLuke K Kilroy, PA-C      . rosuvastatin (CRESTOR) tablet 40 mg  40 mg Oral q1800 Yvonne Kendallhristopher End, MD   40 mg at 12/15/15 1715  . sodium chloride flush (NS) 0.9 % injection 3 mL  3 mL Intravenous Q12H Abelino DerrickLuke K Kilroy, PA-C   3 mL at 12/15/15 2307  . sodium chloride flush (NS) 0.9 % injection 3 mL  3 mL Intravenous PRN Eda PaschalLuke K Kilroy, PA-C      . sodium chloride flush (NS) 0.9 % injection 3 mL  3 mL Intravenous Q12H Christopher End, MD      . sodium chloride flush (NS) 0.9 % injection 3 mL  3 mL Intravenous PRN Christopher End, MD      . zolpidem (AMBIEN) tablet 5 mg  5 mg Oral QHS PRN,MR X 1 Abelino DerrickLuke K Kilroy, PA-C  PE: General appearance: alert, cooperative and no distress Neck: no carotid bruit and no JVD Lungs: clear to auscultation bilaterally Heart: regular rate and rhythm, S1, S2 normal, no murmur, click, rub or gallop Extremities: no LEE Pulses: 2+ and symmetric Skin: warm and dry Neurologic: Grossly normal  Lab Results:   Recent Labs  12/15/15 0511  WBC 7.6  HGB 13.2  HCT 39.2  PLT 232   BMET  Recent Labs  12/15/15 0511  NA 138  K 3.8  CL 107  CO2 26  GLUCOSE 98  BUN 15  CREATININE 0.97  CALCIUM 9.8   PT/INR  Recent Labs  12/15/15 0511  LABPROT 13.0  INR 0.98    Studies/Results: Procedures   Left Heart Cath and Coronary Angiography  Conclusion     Mid LAD lesion, 70 %stenosed.  Mid RCA lesion, 10 %stenosed.  LV end diastolic pressure is normal.   1. Non-critical single-vessel coronary artery disease with 70% stenosis in mid LAD. 2. Patent stent in mid RCA. 3. Upper normal left ventricular filling pressure.  Plan: 1. Optimize medical  therapy, including addition of Imdur and escalation of statin therapy. 2. If chest pain remains refractory to maximal medical therapy, PCI to mid LAD could be considered, though this may jeopardize large diagonal branch that arises immediately proximal to the LAD stenosis.      Assessment/Plan  Principal Problem:   Chest pain with moderate risk of acute coronary syndrome Active Problems:   Dyslipidemia   Incarceration   Coronary artery disease involving coronary bypass graft of native heart with unstable angina pectoris (HCC)   1. Chest Pain/CAD: LHC 12/15/15 demonstrated non-critical single-vessel CAD with 70% stenosis in the mid LAD. Patent stent in the mid RCA. Optimization of medical therapy elected, including addition of Imdur and escalation of statin therapy. If chest pain remains refractory to maximal medical therapy, PCI to mid LAD could be considered, though this may jeopardize large diagonal branch that arises immediately proximal to the LAD stenosis. Normal LVEF at 55-60% by echo. He is CP free. No dyspnea. Cath site stable. VSS. Renal function stable. Continue ASA, Crestor, metoprolol, Imdur and amlodipine.   2. DLD: controlled. Continue Crestor, 40 mg nightly.   Dispo: d/c from hospital once cleared by MD.    LOS: 3 days    Brittainy M. Delmer IslamSimmons, PA-C 12/16/2015 9:18 AM   Attending Note:   The patient was seen and examined.  Agree with assessment and plan as noted above.  Changes made to the above note as needed.  Patient seen and independently examined with Brittainy Simmons,PA .   We discussed all aspects of the encounter. I agree with the assessment and plan as stated above.  Pt has a mid LAD stenosis that is moderate in severity. OK for Dc He is pain free.   Ready for DC    I have spent a total of 40 minutes with patient reviewing hospital  notes , telemetry, EKGs, labs and examining patient as well as establishing an assessment and plan that was discussed  with the patient. > 50% of time was spent in direct patient care.   Vesta MixerPhilip J. Jaben Benegas, Montez HagemanJr., MD, [facility] 12/16/2015, 12:11 PM [address],  [address] Office [phone] Pager [phone]

## 2015-12-16 NOTE — Progress Notes (Signed)
Matthias HughsAnthony XXXCampbell to be D/C'd Home per MD order. Discussed with the patient and all questions fully answered.    Medication List    STOP taking these medications   prasugrel 10 MG Tabs tablet Commonly known as:  EFFIENT   simvastatin 20 MG tablet Commonly known as:  ZOCOR     TAKE these medications   amLODipine 10 MG tablet Commonly known as:  NORVASC Take 10 mg by mouth daily.   aspirin 81 MG tablet Take 81 mg by mouth daily.   isosorbide mononitrate 30 MG 24 hr tablet Commonly known as:  IMDUR Take 1 tablet (30 mg total) by mouth daily.   lisinopril 5 MG tablet Commonly known as:  PRINIVIL,ZESTRIL TAKE ONE TABLET BY MOUTH TWICE DAILY   metoprolol 50 MG tablet Commonly known as:  LOPRESSOR Take 75 mg by mouth 2 (two) times daily. What changed:  Another medication with the same name was removed. Continue taking this medication, and follow the directions you see here.   MULTIVITAMIN PO Take 1 tablet by mouth daily.   nitroGLYCERIN 0.4 MG SL tablet Commonly known as:  NITROSTAT Place 1 tablet (0.4 mg total) under the tongue every 5 (five) minutes as needed for chest pain.   rosuvastatin 40 MG tablet Commonly known as:  CRESTOR Take 1 tablet (40 mg total) by mouth at bedtime. What changed:  See the new instructions.       VVS, Skin clean, dry and intact without evidence of skin break down, no evidence of skin tears noted.  IV catheter discontinued intact. Site without signs and symptoms of complications. Dressing and pressure applied.  An After Visit Summary was printed and given to the patient.  Patient escorted via WC, and D/C home via private auto.  Kai LevinsJacobs, Amiir Heckard N  12/16/2015 3:00 PM

## 2015-12-16 NOTE — Discharge Summary (Signed)
Discharge Summary    Patient ID: Alan Hernandez,  MRN: [id number], DOB/AGE: Mar 11, 1961 55 y.o.  Admit date: 12/12/2015 Discharge date: 12/16/2015  Primary Care Provider: Pcp Not In System Primary Cardiologist: Dr. 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  Mid RCA lesion, 10 %stenosed.  LV end diastolic pressure is normal.   1. Non-critical single-vessel coronary artery disease with 70% stenosis in mid LAD. 2. Patent stent in mid RCA. 3. Upper normal left ventricular filling pressure.    2D echo 12/15/15 Study Conclusions  - Left ventricle: The cavity size was mildly dilated. Systolic   function was normal. The estimated ejection fraction was in the   range of 55% to 60%. Wall motion was normal; there were no   regional wall motion abnormalities. Left ventricular diastolic   function parameters were normal. - Aortic valve: There was mild regurgitation.    History of Present Illness     55 y/o male with a history of ASCAD with remote MI and PCI with DES to RCA in 2012.  He has been incarcerated since 2013. He was admitted 12/12/15 with chest pain. Ruled out for MI with negative cardiac enzymes. NST abnormal with moderate severity, moderate sized defect involving the apex and inferior septal wall from apical to mid section. He was referred for definitive LHC on 12/15/15. He was found to have non-critical single-vessel CAD with 70% stenosis in the mid LAD. Patent stent in the mid RCA. No PCI was performed. Optimization of medical therapy was elected, including addition  of Imdur and escalation of statin therapy. Per Dr. Okey DupreEnd, if chest pain remains refractory to maximal medical therapy, PCI to mid LAD could be considered, though this may jeopardize large diagonal branch that arises immediately proximal to the LAD stenosis. LVEF was normal at 55-60%. He was continued on ASA, Crestor, metoprolol and amlodipine. Imdur was added. Crestor was increased to 40 mg. He was monitored overnight. No recurrent CP. He ambulated with cardiac rehab w/o difficulty. No post cath complications. He was last seen and examined by Dr. Elease HashimotoNahser who determined he was stable for d/c. 2 week post hospital f/u has been arranged with Nicolasa Duckinghristopher Berge, NP.   Hospital Course     Consultants: none   Discharge Vitals Blood pressure 128/66, pulse 66, temperature 97.5 F (36.4 C), temperature source Oral, resp. rate 18, height 6' (1.829 m), weight 184 lb 11.2 oz (83.8 kg), SpO2 98 %.  Filed Weights   12/14/15 1254 12/15/15 0607  Weight: 185 lb 6.4 oz (84.1 kg) 184 lb 11.2 oz (83.8 kg)    Labs & Radiologic Studies    CBC  Recent Labs  12/15/15 0511  WBC 7.6  HGB 13.2  HCT 39.2  MCV 94.0  PLT 232   Basic Metabolic Panel  Recent Labs  12/15/15 0511  NA 138  K 3.8  CL 107  CO2 26  GLUCOSE 98  BUN 15  CREATININE 0.97  CALCIUM 9.8   Liver Function Tests No results for input(s): AST, ALT, ALKPHOS, BILITOT, PROT, ALBUMIN in the last 72 hours.  No results for input(s): LIPASE, AMYLASE in the last 72 hours. Cardiac Enzymes No results for input(s): CKTOTAL, CKMB, CKMBINDEX, TROPONINI in the last 72 hours. BNP Invalid input(s): POCBNP D-Dimer No results for input(s): DDIMER in the last 72 hours. Hemoglobin A1C No results for input(s): HGBA1C in the last 72 hours. Fasting Lipid Panel No results for input(s): CHOL, HDL, LDLCALC, TRIG, CHOLHDL, LDLDIRECT in the last 72 hours. Thyroid Function Tests No results for input(s): TSH, T4TOTAL, T3FREE, THYROIDAB in the last 72  hours.  Invalid input(s): FREET3 _____________  Dg Chest 2 View  Result Date: 12/12/2015 CLINICAL DATA:  Shooting pain in center of abdomen going to the RIGHT, history MI and prior coronary stenting, queasiness, diaphoresis EXAM: CHEST  2 VIEW COMPARISON:  12/31/2010 FINDINGS: Normal heart size, mediastinal contours, and pulmonary vascularity. Lungs clear. No pleural effusion or pneumothorax. Bones unremarkable. IMPRESSION: Normal exam. Electronically Signed   By: Ulyses SouthwardMark  Boles M.D.   On: 12/12/2015 15:09   Nm Myocar Multi W/spect W/wall Motion / Ef  Result Date: 12/13/2015 CLINICAL DATA:  chest pain. Prior myocardial infarction in 2012. Coronary artery disease. EXAM: MYOCARDIAL IMAGING WITH SPECT (REST AND PHARMACOLOGIC-STRESS) GATED LEFT VENTRICULAR WALL MOTION STUDY LEFT VENTRICULAR EJECTION FRACTION TECHNIQUE: Standard myocardial SPECT imaging was performed after resting intravenous injection of 10 mCi Tc-3380m tetrofosmin. Subsequently, intravenous infusion of Lexiscan was performed under the supervision of the Cardiology staff. At peak effect of the drug, 30 mCi Tc-4780m tetrofosmin was injected intravenously and standard myocardial SPECT imaging was performed. Quantitative gated imaging was also performed to evaluate left ventricular wall motion, and estimate left ventricular ejection fraction. COMPARISON:  Chest radiograph of 1 day prior. FINDINGS: Perfusion: moderate severity, moderate size defect is identified involving the apex and inferior septal wall apical to mid. Wall Motion: Hypokinesis involving the septal and inferoseptal wall. Left Ventricular Ejection Fraction: 51 % End diastolic volume 114 ml End systolic volume 55 ml IMPRESSION: 1. Moderate severity moderate-sized defect involving the apex and inferior septal wall, suspicious for inducible ischemia. 2. Septal wall hypokinesis, mild. 3. Left ventricular ejection fraction 51% 4. Non invasive risk stratification*: Intermediate *2012  Appropriate Use Criteria for Coronary Revascularization Focused Update: J Am Coll Cardiol. 2012;59(9):857-881. http://content.dementiazones.comonlinejacc.org/article.aspx?articleid=1201161 These results will be called to the ordering clinician or representative by the Radiologist Assistant, and communication documented in the PACS or zVision Dashboard. Electronically Signed   By: Jeronimo GreavesKyle  Talbot M.D.   On: 12/13/2015 14:55   Disposition   Pt is being discharged home today in good condition.  Follow-up Plans & Appointments    Follow-up Information    Nicolasa Duckinghristopher Berge, NP Follow up on 01/13/2016.   Specialties:  Nurse Practitioner, Cardiology, Radiology Why:  3:30 PM Cardiology follow-up  Contact information: [address] [place] [id number] [phone]          Discharge Instructions    Diet - low sodium heart healthy    Complete by:  As directed   Increase activity slowly    Complete by:  As directed      Discharge Medications   Discharge Medication List as of 12/16/2015 12:35 PM    START taking these medications   Details  isosorbide mononitrate (IMDUR) 30 MG 24 hr tablet Take 1 tablet (30 mg total) by mouth daily., Starting Tue 12/16/2015, Normal      CONTINUE these medications which have CHANGED   Details  nitroGLYCERIN (NITROSTAT) 0.4 MG SL tablet Place 1 tablet (0.4 mg total) under the tongue every  5 (five) minutes as needed for chest pain., Starting Tue 12/16/2015, Normal    rosuvastatin (CRESTOR) 40 MG tablet Take 1 tablet (40 mg total) by mouth at bedtime., Starting Tue 12/16/2015, Normal      CONTINUE these medications which have NOT CHANGED   Details  amLODipine (NORVASC) 10 MG tablet Take 10 mg by mouth daily., Historical Med    aspirin 81 MG tablet Take 81 mg by mouth daily.  , Historical Med    lisinopril (PRINIVIL,ZESTRIL) 5 MG tablet TAKE ONE TABLET BY MOUTH TWICE DAILY, Normal    metoprolol (LOPRESSOR) 50 MG tablet Take 75 mg by mouth 2 (two) times daily.,  Historical Med    Multiple Vitamins-Minerals (MULTIVITAMIN PO) Take 1 tablet by mouth daily., Historical Med      STOP taking these medications     prasugrel (EFFIENT) 10 MG TABS      simvastatin (ZOCOR) 20 MG tablet          Aspirin prescribed at discharge?  Yes High Intensity Statin Prescribed? (Lipitor 40-80mg  or Crestor 20-40mg ): Yes Beta Blocker Prescribed? Yes For EF <40%, was ACEI/ARB Prescribed? Yes ADP Receptor Inhibitor Prescribed? (i.e. Plavix etc.-Includes Medically Managed Patients): No For EF <40%, Aldosterone Inhibitor Prescribed? No: EF >40% Was EF assessed during THIS hospitalization? Yes Was Cardiac Rehab II ordered? (Included Medically managed Patients): No:    Outstanding Labs/Studies   None  Duration of Discharge Encounter   Greater than 30 minutes including physician time.  Signed, Robbie Lis PA-C 12/16/2015, 5:16 PM   Attending Note:   The patient was seen and examined.  Agree with assessment and plan as noted above.  Changes made to the above note as needed.  Patient seen and independently examined with Robbie Lis, PA .   We discussed all aspects of the encounter. I agree with the assessment and plan as stated above.  Pt has nonobstructive CAD Pt is ready for DC    I have spent a total of 40 minutes with patient reviewing hospital  notes , telemetry, EKGs, labs and examining patient as well as establishing an assessment and plan that was discussed with the patient. > 50% of time was spent in direct patient care.    Vesta Mixer, Montez Hageman., MD, [facility] 12/18/2015, 3:25 PM [address]  [address] Office [phone] Pager [phone]

## 2016-01-13 ENCOUNTER — Encounter: Payer: Self-pay | Admitting: Nurse Practitioner

## 2016-01-13 ENCOUNTER — Ambulatory Visit: Admitting: Nurse Practitioner

## 2016-01-13 DIAGNOSIS — I2510 Atherosclerotic heart disease of native coronary artery without angina pectoris: Secondary | ICD-10-CM | POA: Insufficient documentation

## 2016-01-13 DIAGNOSIS — I119 Hypertensive heart disease without heart failure: Secondary | ICD-10-CM | POA: Insufficient documentation
